# Patient Record
Sex: Male | Born: 1960
Health system: Southern US, Community
[De-identification: ages and names within clinical notes are randomized; demographics above are authoritative.]

## PROBLEM LIST (undated history)

## (undated) DIAGNOSIS — I10 Essential (primary) hypertension: Secondary | ICD-10-CM

## (undated) DIAGNOSIS — I2510 Atherosclerotic heart disease of native coronary artery without angina pectoris: Secondary | ICD-10-CM

## (undated) DIAGNOSIS — E119 Type 2 diabetes mellitus without complications: Secondary | ICD-10-CM

---

## 2006-08-02 ENCOUNTER — Emergency Department (HOSPITAL_COMMUNITY): Admission: EM | Admit: 2006-08-02 | Discharge: 2006-08-02 | Payer: Self-pay | Admitting: Emergency Medicine

## 2006-09-11 ENCOUNTER — Emergency Department (HOSPITAL_COMMUNITY): Admission: EM | Admit: 2006-09-11 | Discharge: 2006-09-11 | Payer: Self-pay | Admitting: Emergency Medicine

## 2006-09-20 ENCOUNTER — Ambulatory Visit: Payer: Self-pay | Admitting: Family Medicine

## 2006-09-23 ENCOUNTER — Ambulatory Visit: Payer: Self-pay | Admitting: *Deleted

## 2006-10-22 ENCOUNTER — Ambulatory Visit: Payer: Self-pay | Admitting: Family Medicine

## 2006-11-20 ENCOUNTER — Ambulatory Visit: Payer: Self-pay | Admitting: Family Medicine

## 2007-03-11 DIAGNOSIS — K219 Gastro-esophageal reflux disease without esophagitis: Secondary | ICD-10-CM | POA: Insufficient documentation

## 2007-03-11 DIAGNOSIS — I10 Essential (primary) hypertension: Secondary | ICD-10-CM

## 2007-03-11 DIAGNOSIS — J42 Unspecified chronic bronchitis: Secondary | ICD-10-CM

## 2007-03-11 DIAGNOSIS — E785 Hyperlipidemia, unspecified: Secondary | ICD-10-CM

## 2007-03-12 DIAGNOSIS — F17200 Nicotine dependence, unspecified, uncomplicated: Secondary | ICD-10-CM | POA: Insufficient documentation

## 2007-03-12 DIAGNOSIS — I2510 Atherosclerotic heart disease of native coronary artery without angina pectoris: Secondary | ICD-10-CM | POA: Insufficient documentation

## 2007-03-12 DIAGNOSIS — I219 Acute myocardial infarction, unspecified: Secondary | ICD-10-CM | POA: Insufficient documentation

## 2007-03-13 ENCOUNTER — Emergency Department (HOSPITAL_COMMUNITY): Admission: EM | Admit: 2007-03-13 | Discharge: 2007-03-13 | Payer: Self-pay | Admitting: Emergency Medicine

## 2007-03-24 ENCOUNTER — Ambulatory Visit: Payer: Self-pay | Admitting: Family Medicine

## 2007-03-24 LAB — CONVERTED CEMR LAB
Cholesterol: 115 mg/dL (ref 0–200)
HDL: 24 mg/dL — ABNORMAL LOW (ref >39)
Total CHOL/HDL Ratio: 4.8
Triglycerides: 110 mg/dL (ref <150)
VLDL: 22 mg/dL (ref 0–40)

## 2007-04-13 ENCOUNTER — Inpatient Hospital Stay (HOSPITAL_COMMUNITY): Admission: EM | Admit: 2007-04-13 | Discharge: 2007-04-15 | Payer: Self-pay | Admitting: Emergency Medicine

## 2007-04-14 ENCOUNTER — Encounter (INDEPENDENT_AMBULATORY_CARE_PROVIDER_SITE_OTHER): Payer: Self-pay | Admitting: Internal Medicine

## 2007-04-21 ENCOUNTER — Ambulatory Visit: Payer: Self-pay | Admitting: Family Medicine

## 2007-05-12 ENCOUNTER — Ambulatory Visit: Payer: Self-pay | Admitting: Family Medicine

## 2007-05-12 LAB — CONVERTED CEMR LAB
HDL: 27 mg/dL — ABNORMAL LOW (ref >39)
Triglycerides: 152 mg/dL — ABNORMAL HIGH (ref <150)

## 2007-08-12 ENCOUNTER — Ambulatory Visit (HOSPITAL_COMMUNITY): Admission: RE | Admit: 2007-08-12 | Discharge: 2007-08-12 | Payer: Self-pay | Admitting: Family Medicine

## 2007-08-12 ENCOUNTER — Ambulatory Visit: Payer: Self-pay | Admitting: Family Medicine

## 2007-09-02 ENCOUNTER — Ambulatory Visit (HOSPITAL_COMMUNITY): Admission: RE | Admit: 2007-09-02 | Discharge: 2007-09-02 | Payer: Self-pay | Admitting: Family Medicine

## 2007-10-23 ENCOUNTER — Emergency Department (HOSPITAL_COMMUNITY): Admission: EM | Admit: 2007-10-23 | Discharge: 2007-10-23 | Payer: Self-pay | Admitting: Emergency Medicine

## 2007-11-28 ENCOUNTER — Ambulatory Visit: Payer: Self-pay | Admitting: Internal Medicine

## 2008-01-29 ENCOUNTER — Ambulatory Visit: Payer: Self-pay | Admitting: Family Medicine

## 2008-02-11 ENCOUNTER — Ambulatory Visit: Payer: Self-pay | Admitting: Family Medicine

## 2008-02-11 LAB — CONVERTED CEMR LAB
Calcium: 8.8 mg/dL (ref 8.4–10.5)
Cholesterol: 131 mg/dL (ref 0–200)
LDL Cholesterol: 59 mg/dL (ref 0–99)
Total Bilirubin: 0.5 mg/dL (ref 0.3–1.2)
Total Protein: 7 g/dL (ref 6.0–8.3)

## 2008-07-25 ENCOUNTER — Emergency Department (HOSPITAL_COMMUNITY): Admission: EM | Admit: 2008-07-25 | Discharge: 2008-07-25 | Payer: Self-pay | Admitting: Emergency Medicine

## 2008-07-30 ENCOUNTER — Ambulatory Visit: Payer: Self-pay | Admitting: Family Medicine

## 2010-08-06 ENCOUNTER — Encounter: Payer: Self-pay | Admitting: Interventional Cardiology

## 2010-10-30 LAB — POCT I-STAT, CHEM 8
BUN: 11 mg/dL (ref 6–23)
Calcium, Ion: 1.03 mmol/L — ABNORMAL LOW (ref 1.12–1.32)
Glucose, Bld: 108 mg/dL — ABNORMAL HIGH (ref 70–99)
HCT: 43 % (ref 39.0–52.0)
Sodium: 136 mEq/L (ref 135–145)
TCO2: 26 mmol/L (ref 0–100)

## 2010-10-30 LAB — POCT CARDIAC MARKERS
CKMB, poc: 1.2 ng/mL (ref 1.0–8.0)
Myoglobin, poc: 116 ng/mL (ref 12–200)

## 2010-11-28 NOTE — H&P (Signed)
NAME:  Nathan Jenkins, [place] NO.:  [id number]   MEDICAL RECORD NO.:  [id number]          PATIENT TYPE:  EMS   LOCATION:  ED                           FACILITY:  [facility]   PHYSICIAN:  Herbie Saxon, MDDATE OF BIRTH:  09-30-1960   DATE OF ADMISSION:  04/13/2007  DATE OF DISCHARGE:                              HISTORY & PHYSICAL   PRIMARY CARE PHYSICIAN:  Maurice March, M.D. at [facility],  unassigned.   PRESENTING COMPLAINT:  Chest pain of one day's duration.   HISTORY OF PRESENTING COMPLAINT:  This is a 50 year old African American  male who woke up this morning at 8 a.m. and noticed substernal chest  pain, 8/10, burning in quality and radiating to the left arm, associated  with mild shortness of breath, no diaphoresis, no syncopal episode, no  loss of consciousness.  He denies any dizziness or lightheadedness.  He  also gives a history of associated upper abdominal pain and also says  the chest pain sometimes relieved when he burps.  He also noted that the  chest pain was slightly reduced by taking antacid.  He gives a positive  history of lifting heavy material yesterday because he went to help a  family member move and he also gives a history of having diarrhea for  about a week after he was started on gout medication.   PAST MEDICAL HISTORY:  1. Hypertension, 30 years' duration.  2. Myocardial infarction in 2005.  3. Mitral valve prolapse.  4. Gout.  5. Endocarditis.   SURGERY:  Angioplasty, 2005.   FAMILY HISTORY:  Father died of an MI.  Mother had heart disease.  All  his siblings have hypertension.   SOCIAL HISTORY:  He has been smoking one pack per day for greater than  30 years.  He drinks 4-6 bottles of beer daily for more than 30 years.  He is trying to slow down.  No history of drugs.  He said he moved from  [place] in January 2008.  He does not have any established  cardiologist in [place].   No known drug allergies.   MEDICATIONS:  1. Aspirin 325 mg daily.  2. Benazepril 40 mg q.h.s.  3. Diltiazem 118 mg daily.  4. HCTZ 12.5 mg daily.  5. Nitroglycerin 0.4 mg sublingual p.r.n.  6. Plavix 75 mg daily.  7. Protonix 40 mg daily.  8. Toprol XL 200 mg daily.  9. Allopurinol.  10.Colchicine.   REVIEW OF SYSTEMS:  MUSCULOSKELETAL:  There is no joint swelling.  No  skin rash.  Mood is stable.  Nine other systems reviewed, pertinent  positives as above.   PHYSICAL EXAMINATION:  GENERAL:  This is a middle-aged not in acute  respiratory distress.  VITAL SIGNS:  Temperature is 98, pulse is 78, respiratory rate is 17,  blood pressure 101/74.  HEENT:  Pupils equal and reactive to light and accommodation.  NECK:  Supple.  CHEST:  Clear.  HEART:  Sounds 1 and 2 regular.  ABDOMEN:  Soft, nontender.  No organomegaly.  Bowel sounds are  normoactive.  NEUROLOGIC:  He is  alert, oriented x3.  EXTREMITIES:  Peripheral pulses present.  No pedal edema.   LABORATORY:  Show WBC 7, hematocrit 38, platelet count is 171.  Glucose  is 102, sodium 138, potassium 3.1, chloride 103, bicarbonate 24, BUN 26,  creatinine 1.6.  CK-MB 2.2, troponin less than 0.05.  EKG shows a normal  sinus rhythm at 68 per minute.   ASSESSMENT:  1. Chest pain rule out myocardial infarction likely secondary to      musculoskeletal versus gastroesophageal reflux disease.  2. History of coronary artery disease, status post percutaneous      transluminal coronary angioplasty.  3. Hypokalemia.  4. Gastroenteritis.  5. Acute renal insufficiency.  6. History of gout.  7. Endocarditis.  8. Mitral valve prolapse.   PLAN:  1. The patient is being admitted to a telemetry bed.  2. We will check his serial cardiac enzymes and EKGs q.8 h. x3.  3. We will replete his potassium 40 mEq p.o.  4. Start IV fluids 1/2 normal saline at 60 cc/hr.  5. We will check his 2D echocardiogram.  6. If cardiac enzymes turn positive consider cardiology input for       possible stress testing a.m.  7. NPO after midnight.  8. We will start him on Protonix 40 mg IV daily.  9. Lovenox 40 mg subcutaneous daily.  10.Morphine 2 mg IV q.6 h. p.r.n.  11.Oxygen 2 liters nasal cannula p.r.n.  12.Aspirin 325 mg daily.  13.Continue with his home medications  14.Hold off with his diuretic agents for now.  15.Hold off the allopurinol and colchicine.  16.He is a full code.      Herbie Saxon, MD  Electronically Signed     MIO/MEDQ  D:  04/13/2007  T:  04/13/2007  Job:  [id number]

## 2010-11-28 NOTE — Discharge Summary (Signed)
Nathan Jenkins, [place] NO.:  [id number]   MEDICAL RECORD NO.:  [id number]          PATIENT TYPE:  INP   LOCATION:  1417                         FACILITY:  [facility]   PHYSICIAN:  Marcellus Scott, MD     DATE OF BIRTH:  05-04-1961   DATE OF ADMISSION:  04/13/2007  DATE OF DISCHARGE:  04/15/2007                               DISCHARGE SUMMARY   PRIMARY MEDICAL DOCTOR:  Dr. Dow Adolph, of [facility].   DISCHARGE DIAGNOSES:  1. Chest pain likely musculoskeletal - resolved.  2. Coronary artery disease, stable.  3. Hypertension.  4. Acute renal failure.  5. Tobacco abuse.  6. Alcohol dependence.  7. Mild rhabdomyolysis.  8. Hyperlipidemia.  9. Mild diastolic dysfunction.  10.Diarrhea- resolved   DISCHARGE MEDICATIONS:  1. Enteric coated aspirin 325 mg p.o. daily.  2. Benazepril 40 mg p.o. twice daily.  3. Cardizem 360 mg p.o. daily.  4. Hydrochlorothiazide 12.5 mg p.o. daily.  5. Nitroglycerin patch, one patch daily.  6. Plavix 75 mg p.o. daily.  7. Protonix 40 mg p.o. daily.  8. Colchicine 0.6 mg p.o. twice daily.  9. Metoprolol 100mg  p.o twicw daily.  10.Crestor 5 mg p.o daily.  11.Atrovent inhaler 2 puffs t.i.d p.r.n.  12.Thiamine 100mg  p.o. daily.  13.Folate 1mg  p.o. daily.  14.Multivitamins, one p.o. daily.  15.Ativan 1 mg p.o. b.i.d. for 1 day then 1 mg p.o. daily for a day      and then discontinue.   PROCEDURES:  1. Echocardiogram:   SUMMARY:  His overall left ventricular systolic function was normal.  Left ventricular ejection fraction was estimated ranging between 60 and  65%.  No left ventricular regional wall motion abnormality.  Left  ventricular wall thickness was at the upper limits of normal.  Features  were consistent with mild diastolic dysfunction.   1. Renal ultrasound on the 29th of September is normal, no abnormal      findings.   1. Chest x-ray:  Impression is minimal subsegmental atelectasis left      lung base.   PERTINENT LAB RESULTS:  Basic metabolic panel today with BUN of 15,  creatinine of 1.08, which on admission the creatinine was 1.68.  CBCs  with hemoglobin 12.7, hematocrit 36.93.  T3 and T4 normal.  TSH of  0.463.  Lipid panel with cholesterol 93, triglycerides 245, HDL 17, LDL  27, VLDL 49.  Cardiac panel was negative for features of acute MI but CK  was mildly elevated initially at 395 but decreasing gradually with  latest of 288.   CONSULTATIONS:  None.   HOSPITAL COURSE AND PATIENT DISPOSITION:  For details of initial  admission, please refer to the History and Physical Note.  In summary,  Nathan Jenkins is a 50 year old African-American male patient with history  of hypertension, prior MI, mitral valve prolapse who presented with  history of substernal 8/10 chest pain with associated mild dyspnea on  day of admission.  He was admitted as chest pain, rule out myocardial  infarction.  He was admitted to telemetry bed. His cardiac enzymes were  cycled and were not suggestive  of an acute MI.  His EKG was not  revealing for any acute changes.  He was placed on aspirin.  The patient  indicated that prior to admission patient helped his brother move, in  [place], where he lifted some heavy things, which could have contributed  to his musculoskeletal type of pain.  Patient gave history of  significant alcohol abuse and tobacco abuse and was placed on alcohol  withdrawal protocol.  His creatinine was mildly elevated, following  which he was placed on IV fluids and his ARBsand diuretics were held.  The etiology of his renal insufficiency may be secondary to diarrhea and  NSAID use. His creatinine has normalized.  The ARBs are being restarted.  In any case, patient at this time is stable with no diarrhea or chest  pain and no other complaints and stable to be discharged.  He is to  follow up with his primary medical doctor with CK and basic metabolic  panel within a week's time.  Patient has  also been instructed to seek  immediate medical attention if there is a any further deterioration of  condition.  He has been counseled against tobacco and substance abuse.      Marcellus Scott, MD  Electronically Signed     AH/MEDQ  D:  04/15/2007  T:  04/15/2007  Job:  [id number]   cc:   Maurice March, M.D.  Fax: [phone]

## 2011-04-26 LAB — POCT CARDIAC MARKERS
Myoglobin, poc: 154
Operator id: 4074
Troponin i, poc: <0.05
Troponin i, poc: <0.05

## 2011-04-26 LAB — CBC
HCT: 34.8 — ABNORMAL LOW
Hemoglobin: 12 — ABNORMAL LOW
MCHC: 34.6
MCV: 83.7
MCV: 84
Platelets: 155
RDW: 13
RDW: 13.3
WBC: 5.8

## 2011-04-26 LAB — BASIC METABOLIC PANEL
BUN: 15
BUN: 26 — ABNORMAL HIGH
CO2: 23
CO2: 24
Calcium: 8.8
Chloride: 106
Creatinine, Ser: 1.08
Creatinine, Ser: 1.68 — ABNORMAL HIGH
GFR calc non Af Amer: >60
Glucose, Bld: 102 — ABNORMAL HIGH
Glucose, Bld: 110 — ABNORMAL HIGH
Potassium: 3.1 — ABNORMAL LOW
Potassium: 3.8
Sodium: 136

## 2011-04-26 LAB — OVA AND PARASITE EXAMINATION: Ova and parasites: NONE SEEN

## 2011-04-26 LAB — T4, FREE: Free T4: 1.19

## 2011-04-26 LAB — URINALYSIS, ROUTINE W REFLEX MICROSCOPIC
Ketones, ur: NEGATIVE
Nitrite: NEGATIVE
Protein, ur: NEGATIVE
Urobilinogen, UA: 0.2

## 2011-04-26 LAB — CK TOTAL AND CKMB (NOT AT ARMC)
CK, MB: 4
Relative Index: 1
Total CK: 395 — ABNORMAL HIGH

## 2011-04-26 LAB — CARDIAC PANEL(CRET KIN+CKTOT+MB+TROPI)
CK, MB: 2.7
Total CK: 288 — ABNORMAL HIGH

## 2011-04-26 LAB — FECAL LACTOFERRIN, QUANT

## 2011-04-26 LAB — STOOL CULTURE

## 2011-04-26 LAB — PROTIME-INR: Prothrombin Time: 13.6

## 2011-04-26 LAB — LIPID PANEL
Cholesterol: 93
HDL: 17 — ABNORMAL LOW

## 2011-04-26 LAB — T3, FREE: T3, Free: 2.8 (ref 2.3–4.2)

## 2011-04-26 LAB — TSH: TSH: 0.336 — ABNORMAL LOW

## 2011-04-26 LAB — TROPONIN I: Troponin I: 0.04

## 2011-04-26 LAB — URINE MICROSCOPIC-ADD ON

## 2017-07-10 ENCOUNTER — Other Ambulatory Visit: Payer: Self-pay

## 2017-07-10 ENCOUNTER — Emergency Department (HOSPITAL_COMMUNITY)
Admission: EM | Admit: 2017-07-10 | Discharge: 2017-07-10 | Disposition: A | Discharge status: Home / Self Care | Payer: Medicare HMO | Attending: Emergency Medicine | Admitting: Emergency Medicine

## 2017-07-10 ENCOUNTER — Encounter (HOSPITAL_COMMUNITY): Payer: Self-pay | Admitting: Emergency Medicine

## 2017-07-10 DIAGNOSIS — F17200 Nicotine dependence, unspecified, uncomplicated: Secondary | ICD-10-CM | POA: Diagnosis not present

## 2017-07-10 DIAGNOSIS — E119 Type 2 diabetes mellitus without complications: Secondary | ICD-10-CM | POA: Insufficient documentation

## 2017-07-10 DIAGNOSIS — I2510 Atherosclerotic heart disease of native coronary artery without angina pectoris: Secondary | ICD-10-CM | POA: Diagnosis not present

## 2017-07-10 DIAGNOSIS — I10 Essential (primary) hypertension: Secondary | ICD-10-CM | POA: Insufficient documentation

## 2017-07-10 DIAGNOSIS — Z760 Encounter for issue of repeat prescription: Secondary | ICD-10-CM | POA: Insufficient documentation

## 2017-07-10 HISTORY — DX: Atherosclerotic heart disease of native coronary artery without angina pectoris: I25.10

## 2017-07-10 HISTORY — DX: Type 2 diabetes mellitus without complications: E11.9

## 2017-07-10 HISTORY — DX: Essential (primary) hypertension: I10

## 2017-07-10 MED ORDER — SITAGLIPTIN PHOSPHATE 100 MG PO TABS: 100.0000 mg | ORAL_TABLET | Freq: Every day | ORAL | 0 refills | Status: DC

## 2017-07-10 NOTE — ED Provider Notes (Signed)
[facility] Note   CSN: [id number] Arrival date & time: 07/10/17  [id number]     History   Chief Complaint Chief Complaint  Patient presents with  . Medication Refill    HPI Nathan Jenkins is a 56 y.o. male with a past medical history of hypertension, diabetes, who presents to ED for evaluation of refill of his Januvia.  States that he ran out earlier in the week.  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  He is established with a PCP in [place].  He reports compliance with his other home medications including hypertension medication.  States that his blood pressure usually runs high because he still smokes.  He denies any symptoms at this time.  HPI  Past Medical History:  Diagnosis Date  . Coronary artery disease   . Diabetes mellitus without complication (HCC)   . Hypertension     Patient Active Problem List   Diagnosis Date Noted  . TOBACCO USER 03/12/2007  . MYOCARDIAL INFARCTION, ACUTE 03/12/2007  . CORONARY ARTERY DISEASE 03/12/2007  . DYSLIPIDEMIA 03/11/2007  . HYPERTENSION 03/11/2007  . BRONCHITIS, CHRONIC 03/11/2007  . GERD 03/11/2007    History reviewed. No pertinent surgical history.     Home Medications    Prior to Admission medications   Medication Sig Start Date End Date Taking? Authorizing Provider  sitaGLIPtin (JANUVIA) 100 MG tablet Take 1 tablet (100 mg total) by mouth daily. 07/10/17   Dietrich PatesKhatri, Amiri Tritch, PA-C    Family History No family history on file.  Social History Social History   Tobacco Use  . Smoking status: Not on file  Substance Use Topics  . Alcohol use: Not on file  . Drug use: Not on file     Allergies   Patient has no known allergies.   Review of Systems Review of Systems  Constitutional: Negative for appetite change, chills and fever.  HENT: Negative for ear pain, rhinorrhea, sneezing and sore throat.   Respiratory: Negative for cough,  chest tightness, shortness of breath and wheezing.   Cardiovascular: Negative for chest pain and palpitations.  Gastrointestinal: Negative for abdominal pain, nausea and vomiting.  Musculoskeletal: Negative for myalgias.  Neurological: Negative for dizziness, weakness and light-headedness.     Physical Exam Updated Vital Signs BP (!) 156/112 (BP Location: Right Arm)   Pulse 77   Temp 98.8 F (37.1 C) (Oral)   Resp 18   SpO2 99%   Physical Exam  Constitutional: He appears well-developed and well-nourished. No distress.  HENT:  Head: Normocephalic and atraumatic.  Eyes: Conjunctivae and EOM are normal. No scleral icterus.  Neck: Normal range of motion.  Cardiovascular: Normal rate, regular rhythm and normal heart sounds.  Pulmonary/Chest: Effort normal and breath sounds normal. No respiratory distress.  Neurological: He is alert.  Skin: No rash noted. He is not diaphoretic.  Psychiatric: He has a normal mood and affect.  Nursing note and vitals reviewed.    ED Treatments / Results  Labs (all labs ordered are listed, but only abnormal results are displayed) Labs Reviewed - No data to display  EKG  EKG Interpretation None       Radiology No results found.  Procedures Procedures (including critical care time)  Medications Ordered in ED Medications - No data to display   Initial Impression / Assessment and Plan / ED Course  I have reviewed the triage vital signs and the nursing notes.  Pertinent labs &  imaging results that were available during my care of the patient were reviewed by me and considered in my medical decision making (see chart for details).     Patient presents to ED for evaluation of refill of his Januvia.  Has been on this medication for several months.  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  He is established with a PCP in [place].  Patient is hypertensive here in the ED however, he states that he is compliant with  his home blood pressure medications but states that "my blood pressure is always high because I still smoke."  He denies any symptoms at this time including chest pain, headache, shortness of breath or vision changes.  He does not feel like he needs to be admitted for his high blood pressure.  He is overall well-appearing.  Will give patient refill for Januvia and advised him to follow-up with his primary care provider for further evaluation.  Patient appears stable for discharge at this time.  Strict return precautions given.  Final Clinical Impressions(s) / ED Diagnoses   Final diagnoses:  Medication refill    ED Discharge Orders        Ordered    sitaGLIPtin (JANUVIA) 100 MG tablet  Daily     07/10/17 1041     Portions of this note were generated with Dragon dictation software. Dictation errors may occur despite best attempts at proofreading.    Dietrich PatesKhatri, Elliot Simoneaux, PA-C 07/10/17 1044    Lavera GuiseLiu, Dana Duo, MD 07/10/17 [phone]

## 2017-07-10 NOTE — ED Triage Notes (Signed)
Pt states he need a refill of Januvia because he moved and lost his medication.

## 2017-07-10 NOTE — Discharge Instructions (Signed)
Continue your home medications as previously prescribed. Follow-up with your primary care provider for further evaluation. Return to ED for chest pain, shortness of breath, increased vomiting with high blood sugars, injuries, lightheadedness or loss of consciousness.

## 2017-08-08 ENCOUNTER — Emergency Department (HOSPITAL_COMMUNITY)
Admission: EM | Admit: 2017-08-08 | Discharge: 2017-08-08 | Disposition: A | Discharge status: Home / Self Care | Payer: Medicare HMO | Attending: Emergency Medicine | Admitting: Emergency Medicine

## 2017-08-08 ENCOUNTER — Other Ambulatory Visit: Payer: Self-pay

## 2017-08-08 ENCOUNTER — Encounter (HOSPITAL_COMMUNITY): Payer: Self-pay | Admitting: *Deleted

## 2017-08-08 DIAGNOSIS — I119 Hypertensive heart disease without heart failure: Secondary | ICD-10-CM | POA: Diagnosis not present

## 2017-08-08 DIAGNOSIS — E119 Type 2 diabetes mellitus without complications: Secondary | ICD-10-CM | POA: Diagnosis not present

## 2017-08-08 DIAGNOSIS — Z760 Encounter for issue of repeat prescription: Secondary | ICD-10-CM | POA: Insufficient documentation

## 2017-08-08 DIAGNOSIS — I2510 Atherosclerotic heart disease of native coronary artery without angina pectoris: Secondary | ICD-10-CM | POA: Insufficient documentation

## 2017-08-08 DIAGNOSIS — F17210 Nicotine dependence, cigarettes, uncomplicated: Secondary | ICD-10-CM | POA: Insufficient documentation

## 2017-08-08 MED ORDER — SITAGLIPTIN PHOSPHATE 100 MG PO TABS: 100.0000 mg | ORAL_TABLET | Freq: Every day | ORAL | 0 refills | Status: DC

## 2017-08-08 NOTE — ED Triage Notes (Addendum)
Pt reports recently moving here and needs refill for his [place]. Has not missed a dosage, ran out this am. Denies any complaints.

## 2017-08-08 NOTE — ED Notes (Signed)
Patient here to have his Januvia refilled.

## 2017-08-08 NOTE — ED Provider Notes (Signed)
[facility]   CSN: [id number] Arrival date & time: 08/08/17  1109     History   Chief Complaint Chief Complaint  Patient presents with  . Medication Refill    HPI Nathan CuriaZannie Jenkins is a 57 y.o. male.  HPI   [name] is a 57 year old male with a history of CAD, hypertension, hyperlipidemia, type 2 diabetes mellitus who presents to the emergency department for medication refill.  Patient states he recently moved to [place].  Reports that he established primary care at [facility], although his first appointment is not until March.  States that he has run out of his Januvia medication and asking for refill today. Reports that he is compliant with his other home medications including his antihypertensives. Denies any symptoms at this time.   Past Medical History:  Diagnosis Date  . Coronary artery disease   . Diabetes mellitus without complication (HCC)   . Hypertension     Patient Active Problem List   Diagnosis Date Noted  . TOBACCO USER 03/12/2007  . MYOCARDIAL INFARCTION, ACUTE 03/12/2007  . CORONARY ARTERY DISEASE 03/12/2007  . DYSLIPIDEMIA 03/11/2007  . HYPERTENSION 03/11/2007  . BRONCHITIS, CHRONIC 03/11/2007  . GERD 03/11/2007    History reviewed. No pertinent surgical history.     Home Medications    Prior to Admission medications   Medication Sig Start Date End Date Taking? Authorizing Provider  sitaGLIPtin (JANUVIA) 100 MG tablet Take 1 tablet (100 mg total) by mouth daily. 07/10/17   Dietrich Jenkins, Hina, PA-C    Family History History reviewed. No pertinent family history.  Social History Social History   Tobacco Use  . Smoking status: Not on file  Substance Use Topics  . Alcohol use: Not on file  . Drug use: Not on file     Allergies   Patient has no known allergies.   Review of Systems Review of Systems  Eyes: Negative for visual disturbance.  Respiratory: Negative for  shortness of breath.   Cardiovascular: Negative for chest pain.  Gastrointestinal: Negative for abdominal pain, nausea and vomiting.  Neurological: Negative for headaches.     Physical Exam Updated Vital Signs BP (!) 148/98 (BP Location: Right Arm)   Pulse 69   Temp 98.5 F (36.9 C) (Oral)   Resp 16   Ht 5\' 4"  (1.626 m)   Wt 104.3 kg (230 lb)   SpO2 95%   BMI 39.48 kg/m   Physical Exam  Constitutional: He is oriented to person, place, and time. He appears well-developed and well-nourished. No distress.  HENT:  Head: Normocephalic and atraumatic.  Eyes: Right eye exhibits no discharge. Left eye exhibits no discharge.  Cardiovascular: Normal rate and regular rhythm.  Murmur (systolic grade 3/6 best heard in RUSB) heard. Pulmonary/Chest: Effort normal and breath sounds normal. No stridor. No respiratory distress. He has no wheezes. He has no rales.  Abdominal: Soft. Bowel sounds are normal. There is no tenderness. There is no guarding.  Neurological: He is alert and oriented to person, place, and time. Coordination normal.  Skin: Skin is warm and dry. He is not diaphoretic.  Psychiatric: He has a normal mood and affect. His behavior is normal.  Nursing note and vitals reviewed.    ED Treatments / Results  Labs (all labs ordered are listed, but only abnormal results are displayed) Labs Reviewed - No data to display  EKG  EKG Interpretation None       Radiology No  results found.  Procedures Procedures (including critical care time)  Medications Ordered in ED Medications - No data to display   Initial Impression / Assessment and Plan / ED Course  I have reviewed the triage vital signs and the nursing notes.  Pertinent labs & imaging results that were available during my care of the patient were reviewed by me and considered in my medical decision making (see chart for details).    Patient presents for medication refill.  [place]  of [place].  He has primary care appointment in March.  Will refill his Januvia medication.  His blood pressure was elevated in the ER today, counseled him to have this rechecked and to continue taking his antihypertensive medicine.  He denies symptoms of chest pain, shortness of breath, headache.  He has no complaints prior to discharge.  Final Clinical Impressions(s) / ED Diagnoses   Final diagnoses:  Medication refill    ED Discharge Orders        Ordered    sitaGLIPtin (JANUVIA) 100 MG tablet  Daily     08/08/17 1242       Kellie Shropshire, PA-C 08/08/17 1250    Charlynne Pander, MD 08/08/17 [phone]

## 2017-08-08 NOTE — Discharge Instructions (Addendum)
Your blood pressure was also elevated in the ER today, please have this rechecked.   Please go to your appointment in March with your primary doctor for further medication management.   Return to the Emergency Department if you have any new or worsening symptoms.

## 2017-08-27 ENCOUNTER — Emergency Department (HOSPITAL_COMMUNITY)
Admission: EM | Admit: 2017-08-27 | Discharge: 2017-08-27 | Disposition: A | Discharge status: Home / Self Care | Payer: Medicare HMO | Attending: Emergency Medicine | Admitting: Emergency Medicine

## 2017-08-27 ENCOUNTER — Encounter (HOSPITAL_COMMUNITY): Payer: Self-pay | Admitting: *Deleted

## 2017-08-27 ENCOUNTER — Other Ambulatory Visit: Payer: Self-pay

## 2017-08-27 ENCOUNTER — Emergency Department (HOSPITAL_COMMUNITY): Payer: Medicare HMO

## 2017-08-27 DIAGNOSIS — R0602 Shortness of breath: Secondary | ICD-10-CM | POA: Diagnosis not present

## 2017-08-27 DIAGNOSIS — Z79899 Other long term (current) drug therapy: Secondary | ICD-10-CM | POA: Insufficient documentation

## 2017-08-27 DIAGNOSIS — R079 Chest pain, unspecified: Secondary | ICD-10-CM | POA: Diagnosis not present

## 2017-08-27 DIAGNOSIS — I2510 Atherosclerotic heart disease of native coronary artery without angina pectoris: Secondary | ICD-10-CM | POA: Diagnosis not present

## 2017-08-27 DIAGNOSIS — I10 Essential (primary) hypertension: Secondary | ICD-10-CM | POA: Diagnosis not present

## 2017-08-27 DIAGNOSIS — E119 Type 2 diabetes mellitus without complications: Secondary | ICD-10-CM | POA: Diagnosis not present

## 2017-08-27 DIAGNOSIS — R072 Precordial pain: Secondary | ICD-10-CM | POA: Diagnosis not present

## 2017-08-27 LAB — CBC
HCT: 38.3 % — ABNORMAL LOW (ref 39.0–52.0)
HEMOGLOBIN: 13.1 g/dL (ref 13.0–17.0)
MCH: 29 pg (ref 26.0–34.0)
MCHC: 34.2 g/dL (ref 30.0–36.0)
MCV: 84.9 fL (ref 78.0–100.0)
Platelets: 169 10*3/uL (ref 150–400)
RBC: 4.51 MIL/uL (ref 4.22–5.81)
RDW: 12.5 % (ref 11.5–15.5)
WBC: 6.4 10*3/uL (ref 4.0–10.5)

## 2017-08-27 LAB — I-STAT TROPONIN, ED
TROPONIN I, POC: 0 ng/mL (ref 0.00–0.08)
Troponin i, poc: 0 ng/mL (ref 0.00–0.08)

## 2017-08-27 LAB — BASIC METABOLIC PANEL
ANION GAP: 12 (ref 5–15)
BUN: 9 mg/dL (ref 6–20)
CHLORIDE: 103 mmol/L (ref 101–111)
CO2: 26 mmol/L (ref 22–32)
Calcium: 8.9 mg/dL (ref 8.9–10.3)
Creatinine, Ser: 0.89 mg/dL (ref 0.61–1.24)
GFR calc non Af Amer: >60 mL/min (ref >60)
Glucose, Bld: 92 mg/dL (ref 65–99)
POTASSIUM: 3.4 mmol/L — AB (ref 3.5–5.1)
Sodium: 141 mmol/L (ref 135–145)

## 2017-08-27 MED ORDER — AMLODIPINE BESYLATE 10 MG PO TABS: 10.0000 mg | ORAL_TABLET | Freq: Every day | ORAL | 0 refills | Status: DC

## 2017-08-27 NOTE — ED Notes (Signed)
[id number]

## 2017-08-27 NOTE — Discharge Instructions (Signed)
Your workup has been reassuring in the ED today.  Have given you a refill of your blood pressure medication.  Make sure you follow-up with your cardiologist at your scheduled appointment and return to ED with any worsening symptoms.

## 2017-08-27 NOTE — ED Notes (Signed)
Patient states that he is here because he "ran out of medicine". He said that he takes amlodipine 10mg . Patient denies chest pain at this time. Stating "I'm over that"

## 2017-08-27 NOTE — ED Notes (Signed)
D/c reviewed with patient. No further questions at this

## 2017-08-27 NOTE — ED Triage Notes (Signed)
Pt reports central intermittent chest pain that started this morning with movement. Pt also reports being her for a refill of his amlodipine. Pt has one tablet left.

## 2017-08-27 NOTE — ED Provider Notes (Signed)
[facility] EMERGENCY DEPARTMENT Provider Note   CSN: [id number] Arrival date & time: 08/27/17  0846     History   Chief Complaint Chief Complaint  Patient presents with  . Chest Pain    HPI Nathan Jenkins is a 57 y.o. male.  HPI 57 year old African-American male past medical history significant for CAD, diabetes, hypertension presents to the emergency department today for initial evaluation of chest pain.  However at this time patient states that he just needs a refill of his blood pressure medication.  Patient states that approximately 5:00 this morning he had some substernal chest pain that did not radiate.  Patient states that the chest pain was tightness in his chest.  He reports some shortness of breath however this is baseline for patient.  Patient states that the chest pain lasted for approximately 30 minutes and self resolved.  Patient states that this is not what he felt like when he had stents placed before.  Pt states it feels like "muscular pain". Patient denies any associated nausea, vomiting, diaphoresis.  He did not taking for symptoms prior to arrival.  Patient reports having stent placed in 2005.  States that he recently had a stress test recently that was normal however unable to find this in the computer.  States this was performed in [place].  Patient states that his stent was placed in [place].  Patient states that he does have an follow-up appointment with cardiology at [facility] at the end of this month.  Patient denies any further chest pain at this time.  States that he is running low on his amlodipine and needs help getting this refilled until he can see the cardiology group within the month.  Patient denies any lower extremity edema or calf tenderness.  Denies any pleuritic chest pain.  He is a daily tobacco user.  Chest pain was nonexertional.  Pt denies any fever, chill, ha, vision changes, lightheadedness, dizziness,  congestion, neck pain,  cough, abd pain, n/v/d, urinary symptoms, change in bowel habits, melena, hematochezia, lower extremity paresthesias.  Past Medical History:  Diagnosis Date  . Coronary artery disease   . Diabetes mellitus without complication (HCC)   . Hypertension     Patient Active Problem List   Diagnosis Date Noted  . TOBACCO USER 03/12/2007  . MYOCARDIAL INFARCTION, ACUTE 03/12/2007  . CORONARY ARTERY DISEASE 03/12/2007  . DYSLIPIDEMIA 03/11/2007  . HYPERTENSION 03/11/2007  . BRONCHITIS, CHRONIC 03/11/2007  . GERD 03/11/2007    History reviewed. No pertinent surgical history.     Home Medications    Prior to Admission medications   Medication Sig Start Date End Date Taking? Authorizing Provider  sitaGLIPtin (JANUVIA) 100 MG tablet Take 1 tablet (100 mg total) by mouth daily. 08/08/17   Kellie Shropshire, PA-C    Family History No family history on file.  Social History Social History   Tobacco Use  . Smoking status: Not on file  Substance Use Topics  . Alcohol use: Not on file  . Drug use: Not on file     Allergies   Patient has no known allergies.   Review of Systems Review of Systems  Constitutional: Negative for chills, diaphoresis and fever.  HENT: Negative for congestion.   Eyes: Negative for visual disturbance.  Respiratory: Positive for shortness of breath (baseline). Negative for cough.   Cardiovascular: Positive for chest pain. Negative for palpitations and leg swelling.  Gastrointestinal: Negative for abdominal pain, diarrhea, nausea and  vomiting.  Genitourinary: Negative for dysuria, flank pain, frequency, hematuria and urgency.  Musculoskeletal: Negative for arthralgias and myalgias.  Skin: Negative for rash.  Neurological: Negative for dizziness, syncope, weakness, light-headedness, numbness and headaches.  Psychiatric/Behavioral: Negative for sleep disturbance. The patient is not nervous/anxious.      Physical Exam Updated  Vital Signs BP (!) 151/96   Pulse 72   Temp 98.2 F (36.8 C) (Oral)   Resp 13   Ht 5\' 4"  (1.626 m)   Wt 104.3 kg (230 lb)   SpO2 96%   BMI 39.48 kg/m   Physical Exam  Constitutional: He is oriented to person, place, and time. He appears well-developed and well-nourished.  Non-toxic appearance. No distress.  HENT:  Head: Normocephalic and atraumatic.  Nose: Nose normal.  Mouth/Throat: Oropharynx is clear and moist.  Eyes: Conjunctivae are normal. Pupils are equal, round, and reactive to light. Right eye exhibits no discharge. Left eye exhibits no discharge.  Neck: Normal range of motion. Neck supple. No JVD present. No tracheal deviation present.  Cardiovascular: Normal rate, regular rhythm, normal heart sounds and intact distal pulses. Exam reveals no gallop and no friction rub.  No murmur heard. Pulmonary/Chest: Effort normal. No stridor. No respiratory distress. He has decreased breath sounds. He has no wheezes. He has no rhonchi. He has no rales. He exhibits tenderness (anterior chest).  No hypoxia or tachypnea.  Abdominal: Soft. Bowel sounds are normal. He exhibits no distension. There is no tenderness. There is no rebound and no guarding.  Musculoskeletal: Normal range of motion. He exhibits no tenderness.       Right lower leg: Normal.       Left lower leg: Normal.  No lower extremity edema or calf tenderness.  Lymphadenopathy:    He has no cervical adenopathy.  Neurological: He is alert and oriented to person, place, and time.  Skin: Skin is warm and dry. Capillary refill takes less than 2 seconds. No rash noted. He is not diaphoretic.  Psychiatric: His behavior is normal. Judgment and thought content normal.  Nursing note and vitals reviewed.    ED Treatments / Results  Labs (all labs ordered are listed, but only abnormal results are displayed) Labs Reviewed  BASIC METABOLIC PANEL - Abnormal; Notable for the following components:      Result Value   Potassium 3.4  (*)    All other components within normal limits  CBC - Abnormal; Notable for the following components:   HCT 38.3 (*)    All other components within normal limits  I-STAT TROPONIN, ED  I-STAT TROPONIN, ED    EKG  EKG Interpretation  Date/Time:  Tuesday August 27 2017 09:05:08 EST Ventricular Rate:  77 PR Interval:  164 QRS Duration: 90 QT Interval:  382 QTC Calculation: 432 R Axis:   -12 Text Interpretation:  Normal sinus rhythm Nonspecific T wave abnormality Confirmed by Gwyneth SproutPlunkett, Whitney ([id number]) on 08/27/2017 2:27:05 PM       Radiology Dg Chest 2 View  Result Date: 08/27/2017 CLINICAL DATA:  Chest pain, shortness of breath. EXAM: CHEST  2 VIEW COMPARISON:  Radiographs of July 25, 2008. FINDINGS: The heart size and mediastinal contours are within normal limits. Both lungs are clear. No pneumothorax or pleural effusion is noted. Atherosclerosis of thoracic aorta is noted. The visualized skeletal structures are unremarkable. IMPRESSION: No active cardiopulmonary disease.  Aortic atherosclerosis. Electronically Signed   By: Lupita RaiderJames  Green Jr, M.D.   On: 08/27/2017 09:54    Procedures Procedures (  including critical care time)  Medications Ordered in ED Medications - No data to display   Initial Impression / Assessment and Plan / ED Course  I have reviewed the triage vital signs and the nursing notes.  Pertinent labs & imaging results that were available during my care of the patient were reviewed by me and considered in my medical decision making (see chart for details).     Pt presents to the Ed today with complaints of cp. Patient is to be discharged with recommendation to follow up with PCP in regards to today's hospital visit.  Patient has cardiology follow-up in 2 weeks.  Normal stress test several months ago per patient that was normal.  Chest pain is not likely of cardiac or pulmonary etiology d/t presentation, VSS, no tracheal deviation, no JVD or new murmur, RRR,  breath sounds equal bilaterally, EKG without any change from prior tracing and shows no signs of ischemia, negative delta troponin, and negative CXR.  Clinical presentation does not seem consistent with dissection, PE.  Discussed with patient that we cannot guarantee that this is not cardiac in nature however seems less likely with workup today.  Have discussed with patient that we could possibly admit patient for cardiac workup however patien patient would like to avoid at this time. Which I am in agreeance. Discussed with patient that if he is experiencing further episodes of worsening episodes to return to the ED.  Pt has been advised to return to the ED is CP becomes exertional, associated with diaphoresis or nausea, radiates to left jaw/arm, worsens or becomes concerning in any way. Pt appears reliable for follow up and is agreeable to discharge.   Case has been discussed with and seen by Dr. Anitra Lauth who agrees with the above plan to discharge.     Final Clinical Impressions(s) / ED Diagnoses   Final diagnoses:  Nonspecific chest pain    ED Discharge Orders        Ordered    amLODipine (NORVASC) 10 MG tablet  Daily     08/27/17 1343       Wallace Keller 08/27/17 1448    Gwyneth Sprout, MD 08/27/17 1629

## 2017-09-07 ENCOUNTER — Other Ambulatory Visit: Payer: Self-pay

## 2017-09-07 ENCOUNTER — Encounter (HOSPITAL_COMMUNITY): Payer: Self-pay | Admitting: Emergency Medicine

## 2017-09-07 ENCOUNTER — Emergency Department (HOSPITAL_COMMUNITY)
Admission: EM | Admit: 2017-09-07 | Discharge: 2017-09-07 | Disposition: A | Discharge status: Home / Self Care | Payer: Medicare HMO | Attending: Emergency Medicine | Admitting: Emergency Medicine

## 2017-09-07 DIAGNOSIS — F17200 Nicotine dependence, unspecified, uncomplicated: Secondary | ICD-10-CM | POA: Insufficient documentation

## 2017-09-07 DIAGNOSIS — I10 Essential (primary) hypertension: Secondary | ICD-10-CM | POA: Diagnosis not present

## 2017-09-07 DIAGNOSIS — I252 Old myocardial infarction: Secondary | ICD-10-CM | POA: Insufficient documentation

## 2017-09-07 DIAGNOSIS — I2510 Atherosclerotic heart disease of native coronary artery without angina pectoris: Secondary | ICD-10-CM | POA: Diagnosis not present

## 2017-09-07 DIAGNOSIS — Z79899 Other long term (current) drug therapy: Secondary | ICD-10-CM | POA: Insufficient documentation

## 2017-09-07 DIAGNOSIS — Z760 Encounter for issue of repeat prescription: Secondary | ICD-10-CM | POA: Diagnosis not present

## 2017-09-07 MED ORDER — SITAGLIPTIN PHOSPHATE 100 MG PO TABS: 100.0000 mg | ORAL_TABLET | Freq: Every day | ORAL | 0 refills | Status: DC

## 2017-09-07 NOTE — ED Triage Notes (Signed)
Pt denies any health  Needs other than med refill.

## 2017-09-07 NOTE — ED Notes (Signed)
Declined W/C at D/C and was escorted to lobby by RN. 

## 2017-09-07 NOTE — ED Triage Notes (Signed)
Patient presents needing refill for his [place].  Denies any symptoms or needs for assessment.

## 2017-09-07 NOTE — Discharge Instructions (Addendum)
You were given a medication refill for your Januvia.  Please take this as directed. You were noted to have high blood pressure today during your visit in the emergency department. You will need to follow up with your primary healthcare provider to have your blood pressure rechecked as you may need to be started on medication for this if it remains elevated. If you experience any chest pain, shortness of breath, headaches, vision changes, numbness, weakness, lightheadedness, changes in mental status, or decrease in urination you should return to the emergency department immediately.

## 2017-09-07 NOTE — ED Provider Notes (Signed)
[facility] EMERGENCY DEPARTMENT Provider Note   CSN: [id number] Arrival date & time: 09/07/17  1140     History   Chief Complaint Chief Complaint  Patient presents with  . Medication Refill    HPI Nathan Jenkins. is a 57 y.o. male.  HPI   Pt is a 57 y/o male who presents to the ED requesting a medication refill for his [place].  States he recently just moved here from [place] and he does not have a primary care appointment until 10/05/17.  States that he does not know what blood pressure medication he takes but he has a history of blood pressure.  He denies any symptoms or complaints today.   Past Medical History:  Diagnosis Date  . Coronary artery disease   . Diabetes mellitus without complication (HCC)   . Hypertension     Patient Active Problem List   Diagnosis Date Noted  . TOBACCO USER 03/12/2007  . MYOCARDIAL INFARCTION, ACUTE 03/12/2007  . CORONARY ARTERY DISEASE 03/12/2007  . DYSLIPIDEMIA 03/11/2007  . HYPERTENSION 03/11/2007  . BRONCHITIS, CHRONIC 03/11/2007  . GERD 03/11/2007    History reviewed. No pertinent surgical history.     Home Medications    Prior to Admission medications   Medication Sig Start Date End Date Taking? Authorizing Provider  amLODipine (NORVASC) 10 MG tablet Take 1 tablet (10 mg total) by mouth daily. 08/27/17   Rise Mu, PA-C  carvedilol (COREG) 25 MG tablet Take 25 mg by mouth 2 (two) times daily with a meal.    [provider]  metFORMIN (GLUCOPHAGE) 1000 MG tablet Take 1,000 mg by mouth 2 (two) times daily with a meal.    [provider]  omeprazole (PRILOSEC) 20 MG capsule Take 20 mg by mouth daily.    [provider]  sitaGLIPtin (JANUVIA) 100 MG tablet Take 1 tablet (100 mg total) by mouth daily. 09/07/17   Cully Luckow S, PA-C    Family History History reviewed. No pertinent family history.  Social History Social History   Tobacco Use  . Smoking  status: Current Every Day Smoker    Packs/day: 0.33  . Smokeless tobacco: Never Used  Substance Use Topics  . Alcohol use: Yes    Frequency: Never    Comment: socially  . Drug use: No     Allergies   Patient has no known allergies.   Review of Systems Review of Systems  Constitutional: Negative for fever.  Eyes: Negative for visual disturbance.  Respiratory: Negative for shortness of breath.   Cardiovascular: Negative for chest pain and leg swelling.  Gastrointestinal: Negative for abdominal pain.  Genitourinary: Negative for decreased urine volume.  Musculoskeletal: Negative for back pain.  Neurological: Negative for dizziness, weakness, light-headedness, numbness and headaches.     Physical Exam Updated Vital Signs BP (!) 142/95 (BP Location: Right Arm)   Pulse 79   Temp 98.5 F (36.9 C) (Oral)   Resp 18   SpO2 99%   Physical Exam  Constitutional: He is oriented to person, place, and time. He appears well-developed and well-nourished. No distress.  Eyes: Conjunctivae are normal.  Cardiovascular: Normal rate and regular rhythm.  Pulmonary/Chest: Effort normal and breath sounds normal.  Neurological: He is alert and oriented to person, place, and time. No cranial nerve deficit.  Skin: Skin is warm and dry. Capillary refill takes less than 2 seconds.  Psychiatric: He has a normal mood and affect.  Nursing note and vitals reviewed.  ED Treatments / Results  Labs (all labs ordered are listed, but only abnormal results are displayed) Labs Reviewed - No data to display  EKG  EKG Interpretation None       Radiology No results found.  Procedures Procedures (including critical care time)  Medications Ordered in ED Medications - No data to display   Initial Impression / Assessment and Plan / ED Course  I have reviewed the triage vital signs and the nursing notes.  Pertinent labs & imaging results that were available during my care of the patient were  reviewed by me and considered in my medical decision making (see chart for details).     Final Clinical Impressions(s) / ED Diagnoses   Final diagnoses:  Medication refill  Hypertension, unspecified type   57 year old male with a history of diabetes, high blood pressure, CAD who presents the ED today requesting a medication refill of his Januvia.  Patient states he recently here from [place] and has a primary care appointment on 10/05/17.  Vital signs stable and no complaints today.  Also noted to have high blood pressure today in the ED, which seems consistent with his prior presentations.  States he does not know what blood pressure medication he takes.  States he does not have headache, chest pain, shortness of breath, decreased urine output, or any other symptoms suggestive of endorgan damage. Discussed elevated blood pressure with the patient and the need for primary care follow up with potential need to initiate or change antihypertensive medications and or for further evaluation. Discussed return precaution signs/symptoms for hypertensive emergency as listed above with the patient. He confirmed understanding.     ED Discharge Orders        Ordered    sitaGLIPtin (JANUVIA) 100 MG tablet  Daily     09/07/17 [address]1536       Amanda Steuart S, PA-C 09/07/17 1550    Bethann BerkshireZammit, Joseph, MD 09/08/17 [phone]

## 2017-09-23 ENCOUNTER — Encounter (HOSPITAL_COMMUNITY): Payer: Self-pay | Admitting: Emergency Medicine

## 2017-09-23 ENCOUNTER — Other Ambulatory Visit: Payer: Self-pay

## 2017-09-23 ENCOUNTER — Ambulatory Visit (HOSPITAL_COMMUNITY)
Admission: EM | Admit: 2017-09-23 | Discharge: 2017-09-23 | Disposition: A | Discharge status: Home / Self Care | Payer: Medicare HMO | Attending: Family Medicine | Admitting: Family Medicine

## 2017-09-23 DIAGNOSIS — Z760 Encounter for issue of repeat prescription: Secondary | ICD-10-CM | POA: Diagnosis not present

## 2017-09-23 DIAGNOSIS — I10 Essential (primary) hypertension: Secondary | ICD-10-CM | POA: Diagnosis not present

## 2017-09-23 MED ORDER — FUROSEMIDE 20 MG PO TABS: 20.0000 mg | ORAL_TABLET | Freq: Every day | ORAL | 1 refills | Status: DC

## 2017-09-23 MED ORDER — LOSARTAN POTASSIUM 100 MG PO TABS: 100.0000 mg | ORAL_TABLET | Freq: Every day | ORAL | 1 refills | Status: DC

## 2017-09-23 NOTE — ED Triage Notes (Signed)
Pt here for med refill. No symptoms.

## 2017-09-23 NOTE — ED Provider Notes (Signed)
[facility]   [id number] 09/23/17 Arrival Time: 0955  ASSESSMENT & PLAN:  1. Encounter for medication refill   2. Essential hypertension     Meds ordered this encounter  Medications  . furosemide (LASIX) 20 MG tablet    Sig: Take 1 tablet (20 mg total) by mouth daily.    Dispense:  30 tablet    Refill:  1  . losartan (COZAAR) 100 MG tablet    Sig: Take 1 tablet (100 mg total) by mouth daily.    Dispense:  30 tablet    Refill:  1   Has f/u appointment with new PCP in two weeks. May f/u here if needed.  Reviewed expectations re: course of current medical issues. Questions answered. Outlined signs and symptoms indicating need for more acute intervention. Patient verbalized understanding. After Visit Summary given.   SUBJECTIVE: History from: patient. Nathan Francia GreavesDeberry Jr. is a 57 y.o. male who presents requesting medication refill. No current concerns. Has new PCP f/u soon. Taking medications as directed. Denies SOB/CP/LE edema.  Current medical problems include:  Past Medical History:  Diagnosis Date  . Coronary artery disease   . Diabetes mellitus without complication (HCC)   . Hypertension    Current Outpatient Medications (Endocrine & Metabolic):  .  metFORMIN (GLUCOPHAGE) 1000 MG tablet, Take 1,000 mg by mouth 2 (two) times daily with a meal. .  sitaGLIPtin (JANUVIA) 100 MG tablet, Take 1 tablet (100 mg total) by mouth daily.  Current Outpatient Medications (Cardiovascular):  .  amLODipine (NORVASC) 10 MG tablet, Take 1 tablet (10 mg total) by mouth daily. .  carvedilol (COREG) 25 MG tablet, Take 25 mg by mouth 2 (two) times daily with a meal.  Current Outpatient Medications (Other):  .  omeprazole (PRILOSEC) 20 MG capsule, Take 20 mg by mouth daily.  ROS: As per HPI.   OBJECTIVE:  Vitals:   09/23/17 1013  BP: (!) 164/115  Pulse: 93  Resp: 18  Temp: 98.2 F (36.8 C)  SpO2: 96%    General appearance: alert; no distress Neck: supple  Lungs:  clear to auscultation bilaterally Heart: regular rate and rhythm Abdomen: soft, non-tender Extremities: no edema; symmetrical with no gross deformities Skin: warm and dry Neurologic: normal gait Psychological: alert and cooperative; normal mood and affect  Labs: Results for orders placed or performed during the hospital encounter of 08/27/17  Basic metabolic panel  Result Value Ref Range   Sodium 141 135 - 145 mmol/L   Potassium 3.4 (L) 3.5 - 5.1 mmol/L   Chloride 103 101 - 111 mmol/L   CO2 26 22 - 32 mmol/L   Glucose, Bld 92 65 - 99 mg/dL   BUN 9 6 - 20 mg/dL   Creatinine, Ser 7.820.89 0.61 - 1.24 mg/dL   Calcium 8.9 8.9 - 95.610.3 mg/dL   GFR calc non Af Amer >60 >60 mL/min   GFR calc Af Amer >60 >60 mL/min   Anion gap 12 5 - 15  CBC  Result Value Ref Range   WBC 6.4 4.0 - 10.5 K/uL   RBC 4.51 4.22 - 5.81 MIL/uL   Hemoglobin 13.1 13.0 - 17.0 g/dL   HCT 21.338.3 (L) 08.639.0 - 57.852.0 %   MCV 84.9 78.0 - 100.0 fL   MCH 29.0 26.0 - 34.0 pg   MCHC 34.2 30.0 - 36.0 g/dL   RDW 46.912.5 62.911.5 - 52.815.5 %   Platelets 169 150 - 400 K/uL  I-stat troponin, ED  Result Value Ref Range  Troponin i, poc 0.00 0.00 - 0.08 ng/mL   Comment 3          I-stat troponin, ED  Result Value Ref Range   Troponin i, poc 0.00 0.00 - 0.08 ng/mL   Comment 3           No Known Allergies  Past Medical History:  Diagnosis Date  . Coronary artery disease   . Diabetes mellitus without complication (HCC)   . Hypertension    Social History   Socioeconomic History  . Marital status: Married    Spouse name: Not on file  . Number of children: Not on file  . Years of education: Not on file  . Highest education level: Not on file  Social Needs  . Financial resource strain: Not on file  . Food insecurity - worry: Not on file  . Food insecurity - inability: Not on file  . Transportation needs - medical: Not on file  . Transportation needs - non-medical: Not on file  Occupational History  . Not on file  Tobacco Use    . Smoking status: Current Every Day Smoker    Packs/day: 0.33  . Smokeless tobacco: Never Used  Substance and Sexual Activity  . Alcohol use: Yes    Frequency: Never    Comment: socially  . Drug use: No  . Sexual activity: Not on file  Other Topics Concern  . Not on file  Social History Narrative  . Not on file    No past surgical history on file.   Mardella Layman, MD 09/23/17 1038

## 2017-10-07 ENCOUNTER — Other Ambulatory Visit (INDEPENDENT_AMBULATORY_CARE_PROVIDER_SITE_OTHER): Payer: Medicare HMO

## 2017-10-07 ENCOUNTER — Encounter: Payer: Self-pay | Admitting: Nurse Practitioner

## 2017-10-07 ENCOUNTER — Ambulatory Visit (INDEPENDENT_AMBULATORY_CARE_PROVIDER_SITE_OTHER): Payer: Medicare HMO | Admitting: Nurse Practitioner

## 2017-10-07 ENCOUNTER — Ambulatory Visit (INDEPENDENT_AMBULATORY_CARE_PROVIDER_SITE_OTHER)
Admission: RE | Admit: 2017-10-07 | Discharge: 2017-10-07 | Disposition: A | Discharge status: Home / Self Care | Payer: Medicare HMO | Source: Ambulatory Visit | Attending: Nurse Practitioner | Admitting: Nurse Practitioner

## 2017-10-07 VITALS — BP 158/108 | HR 82 | Temp 99.1°F | Resp 16 | Ht 64.0 in | Wt 237.0 lb

## 2017-10-07 DIAGNOSIS — R221 Localized swelling, mass and lump, neck: Secondary | ICD-10-CM

## 2017-10-07 DIAGNOSIS — E118 Type 2 diabetes mellitus with unspecified complications: Secondary | ICD-10-CM

## 2017-10-07 DIAGNOSIS — K219 Gastro-esophageal reflux disease without esophagitis: Secondary | ICD-10-CM

## 2017-10-07 DIAGNOSIS — E785 Hyperlipidemia, unspecified: Secondary | ICD-10-CM

## 2017-10-07 DIAGNOSIS — I10 Essential (primary) hypertension: Secondary | ICD-10-CM

## 2017-10-07 LAB — COMPREHENSIVE METABOLIC PANEL
ALT: 14 U/L (ref 0–53)
AST: 12 U/L (ref 0–37)
Albumin: 3.8 g/dL (ref 3.5–5.2)
Alkaline Phosphatase: 62 U/L (ref 39–117)
BUN: 11 mg/dL (ref 6–23)
CHLORIDE: 103 meq/L (ref 96–112)
CO2: 33 meq/L — AB (ref 19–32)
Calcium: 8.9 mg/dL (ref 8.4–10.5)
Creatinine, Ser: 0.82 mg/dL (ref 0.40–1.50)
GFR: 123.79 mL/min (ref >60.00)
GLUCOSE: 102 mg/dL — AB (ref 70–99)
Potassium: 3.6 mEq/L (ref 3.5–5.1)
SODIUM: 144 meq/L (ref 135–145)
Total Bilirubin: 0.8 mg/dL (ref 0.2–1.2)
Total Protein: 7.5 g/dL (ref 6.0–8.3)

## 2017-10-07 LAB — LIPID PANEL
Cholesterol: 105 mg/dL (ref 0–200)
HDL: 30.4 mg/dL — ABNORMAL LOW (ref >39.00)
LDL CALC: 50 mg/dL (ref 0–99)
NONHDL: 74.38
Total CHOL/HDL Ratio: 3
Triglycerides: 124 mg/dL (ref 0.0–149.0)
VLDL: 24.8 mg/dL (ref 0.0–40.0)

## 2017-10-07 LAB — HEMOGLOBIN A1C: Hgb A1c MFr Bld: 5.9 % (ref 4.6–6.5)

## 2017-10-07 MED ORDER — SITAGLIPTIN PHOSPHATE 100 MG PO TABS: 100.0000 mg | ORAL_TABLET | Freq: Every day | ORAL | 1 refills | Status: DC

## 2017-10-07 MED ORDER — ATORVASTATIN CALCIUM 20 MG PO TABS: 10.0000 mg | ORAL_TABLET | Freq: Every day | ORAL | 1 refills | Status: DC

## 2017-10-07 MED ORDER — CLONIDINE HCL 0.1 MG PO TABS: 0.1000 mg | ORAL_TABLET | Freq: Two times a day (BID) | ORAL | 1 refills | Status: DC

## 2017-10-07 MED ORDER — METFORMIN HCL 1000 MG PO TABS: 1000.0000 mg | ORAL_TABLET | Freq: Two times a day (BID) | ORAL | 1 refills | Status: DC

## 2017-10-07 MED ORDER — FUROSEMIDE 20 MG PO TABS: 20.0000 mg | ORAL_TABLET | Freq: Every day | ORAL | 1 refills | Status: DC

## 2017-10-07 NOTE — Patient Instructions (Addendum)
Please head downstairs for lab work/chest x-ray.  I have placed an order for an ultrasound of your neck . Our office will call you to schedule this appointment. You should hear from our office in 7-10 days.  I have increased your atorvastatin dosage to 20 mg daily  I have added clonidine 0.1 mg twice daily for your blood pressure. Please continue your other blood pressure medications as you have been taking them. Please try to check your blood pressure once daily or at least a few times a week, at the same time each day, and keep a log. Please return in about 1 month so I can see how you are doing on the new blood pressure medication.  It was good to meet you. Welcome to [facility]!   Mediterranean Diet A Mediterranean diet refers to food and lifestyle choices that are based on the traditions of countries located on the [place]. This way of eating has been shown to help prevent certain conditions and improve outcomes for people who have chronic diseases, like kidney disease and heart disease. What are tips for following this plan? Lifestyle  Cook and eat meals together with your family, when possible.  Drink enough fluid to keep your urine clear or pale yellow.  Be physically active every day. This includes: ? Aerobic exercise like running or swimming. ? Leisure activities like gardening, walking, or housework.  Get 7-8 hours of sleep each night.  If recommended by your health care provider, drink red wine in moderation. This means 1 glass a day for nonpregnant women and 2 glasses a day for men. A glass of wine equals 5 oz (150 mL). Reading food labels  Check the serving size of packaged foods. For foods such as rice and pasta, the serving size refers to the amount of cooked product, not dry.  Check the total fat in packaged foods. Avoid foods that have saturated fat or trans fats.  Check the ingredients list for added sugars, such as corn syrup. Shopping  At the grocery  store, buy most of your food from the areas near the walls of the store. This includes: ? Fresh fruits and vegetables (produce). ? Grains, beans, nuts, and seeds. Some of these may be available in unpackaged forms or large amounts (in bulk). ? Fresh seafood. ? Poultry and eggs. ? Low-fat dairy products.  Buy whole ingredients instead of prepackaged foods.  Buy fresh fruits and vegetables in-season from local farmers markets.  Buy frozen fruits and vegetables in resealable bags.  If you do not have access to quality fresh seafood, buy precooked frozen shrimp or canned fish, such as tuna, salmon, or sardines.  Buy small amounts of raw or cooked vegetables, salads, or olives from the deli or salad bar at your store.  Stock your pantry so you always have certain foods on hand, such as olive oil, canned tuna, canned tomatoes, rice, pasta, and beans. Cooking  Cook foods with extra-virgin olive oil instead of using butter or other vegetable oils.  Have meat as a side dish, and have vegetables or grains as your main dish. This means having meat in small portions or adding small amounts of meat to foods like pasta or stew.  Use beans or vegetables instead of meat in common dishes like chili or lasagna.  Experiment with different cooking methods. Try roasting or broiling vegetables instead of steaming or sauteing them.  Add frozen vegetables to soups, stews, pasta, or rice.  Add nuts or seeds for  added healthy fat at each meal. You can add these to yogurt, salads, or vegetable dishes.  Marinate fish or vegetables using olive oil, lemon juice, garlic, and fresh herbs. Meal planning  Plan to eat 1 vegetarian meal one day each week. Try to work up to 2 vegetarian meals, if possible.  Eat seafood 2 or more times a week.  Have healthy snacks readily available, such as: ? Vegetable sticks with hummus. ? [place] yogurt. ? Fruit and nut trail mix.  Eat balanced meals throughout the week.  This includes: ? Fruit: 2-3 servings a day ? Vegetables: 4-5 servings a day ? Low-fat dairy: 2 servings a day ? Fish, poultry, or lean meat: 1 serving a day ? Beans and legumes: 2 or more servings a week ? Nuts and seeds: 1-2 servings a day ? Whole grains: 6-8 servings a day ? Extra-virgin olive oil: 3-4 servings a day  Limit red meat and sweets to only a few servings a month What are my food choices?  Mediterranean diet ? Recommended ? Grains: Whole-grain pasta. Brown rice. Bulgar wheat. Polenta. Couscous. Whole-wheat bread. Orpah Cobb. ? Vegetables: Artichokes. Beets. Broccoli. Cabbage. Carrots. Eggplant. Green beans. Chard. Kale. Spinach. Onions. Leeks. Peas. Squash. Tomatoes. Peppers. Radishes. ? Fruits: Apples. Apricots. Avocado. Berries. Bananas. Cherries. Dates. Figs. Grapes. Lemons. Melon. Oranges. Peaches. Plums. Pomegranate. ? Meats and other protein foods: Beans. Almonds. Sunflower seeds. Pine nuts. Peanuts. Cod. Salmon. Scallops. Shrimp. Tuna. Tilapia. Clams. Oysters. Eggs. ? Dairy: Low-fat milk. Cheese. Greek yogurt. ? Beverages: Water. Red wine. Herbal tea. ? Fats and oils: Extra virgin olive oil. Avocado oil. Grape seed oil. ? Sweets and desserts: [place] yogurt with honey. Baked apples. Poached pears. Trail mix. ? Seasoning and other foods: Basil. Cilantro. Coriander. Cumin. Mint. Parsley. Sage. Rosemary. Tarragon. Garlic. Oregano. Thyme. Pepper. Balsalmic vinegar. Tahini. Hummus. Tomato sauce. Olives. Mushrooms. ? Limit these ? Grains: Prepackaged pasta or rice dishes. Prepackaged cereal with added sugar. ? Vegetables: Deep fried potatoes (french fries). ? Fruits: Fruit canned in syrup. ? Meats and other protein foods: Beef. Pork. Lamb. Poultry with skin. Hot dogs. Tomasa Blase. ? Dairy: Ice cream. Sour cream. Whole milk. ? Beverages: Juice. Sugar-sweetened soft drinks. Beer. Liquor and spirits. ? Fats and oils: Butter. Canola oil. Vegetable oil. Beef fat (tallow).  Lard. ? Sweets and desserts: Cookies. Cakes. Pies. Candy. ? Seasoning and other foods: Mayonnaise. Premade sauces and marinades. ? The items listed may not be a complete list. Talk with your dietitian about what dietary choices are right for you. Summary  The Mediterranean diet includes both food and lifestyle choices.  Eat a variety of fresh fruits and vegetables, beans, nuts, seeds, and whole grains.  Limit the amount of red meat and sweets that you eat.  Talk with your health care provider about whether it is safe for you to drink red wine in moderation. This means 1 glass a day for nonpregnant women and 2 glasses a day for men. A glass of wine equals 5 oz (150 mL). This information is not intended to replace advice given to you by your health care provider. Make sure you discuss any questions you have with your health care provider. Document Released: 02/23/2016 Document Revised: 03/27/2016 Document Reviewed: 02/23/2016 Elsevier Interactive Patient Education  [employer].

## 2017-10-07 NOTE — Assessment & Plan Note (Signed)
He is fasting, will update labs today Increased atorvastatin dosage due to history of MI Discussed the role of healthy diet and exercise in the management of cholesterol-See AVS for additional information provided to patient - Lipid panel; Future - atorvastatin (LIPITOR) 20 MG tablet; Take 0.5 tablets (10 mg total) by mouth daily.  Dispense: 30 tablet; Refill: 1

## 2017-10-07 NOTE — Assessment & Plan Note (Signed)
Stable, continue prilosec F/U for new or worsening symptoms

## 2017-10-07 NOTE — Assessment & Plan Note (Signed)
Blood pressure is not controlled. We discussed the long term risks of uncontrolled HTN including MI, Stroke, kidney failure, the role of healthy diet and exercise in the management of hypertension, and the importance of close follow up to reach BP goal of <140/90-See AVS for additional information provided to patient Recommended adding additional medication to reach BP goal and he is agreeable- we will continue current medications and add clonidine 0.1mg  BID Encouraged to log BP at home RTC in 1 month for F/U - Comprehensive metabolic panel; Future - furosemide (LASIX) 20 MG tablet; Take 1 tablet (20 mg total) by mouth daily.  Dispense: 90 tablet; Refill: 1 - cloNIDine (CATAPRES) 0.1 MG tablet; Take 1 tablet (0.1 mg total) by mouth 2 (two) times daily.  Dispense: 60 tablet; Refill: 1

## 2017-10-07 NOTE — Assessment & Plan Note (Signed)
Continue current medications Will update labs today and recommend med adjustments if needed - Comprehensive metabolic panel; Future - Hemoglobin A1c; Future - metFORMIN (GLUCOPHAGE) 1000 MG tablet; Take 1 tablet (1,000 mg total) by mouth 2 (two) times daily with a meal.  Dispense: 90 tablet; Refill: 1 - sitaGLIPtin (JANUVIA) 100 MG tablet; Take 1 tablet (100 mg total) by mouth daily.  Dispense: 90 tablet; Refill: 1

## 2017-10-07 NOTE — Progress Notes (Signed)
Name: Nathan Jenkins.   MRN: [id number]    DOB: [birth year]   Date:10/07/2017       Progress Note  Subjective  Chief Complaint  Chief Complaint  Patient presents with  . Establish Care    dry skin, medication refills and looked over    HPI Nathan Jenkins is establishing care with me today as a new patient to our clinic. He is just moving back to [place] from [place]. We will discuss his chronic medical conditions and he would like to talk about a mass on his neck as well.  Hypertension -maintained on amlodipine 10, carvedilol  BID, losartan 100 mg, lasix 20  Reports daily medication compliance without adverse medication effects. Reports he does not check his blood pressure routinely because it is always high. He says "there is no way we will get my blood pressure normal, no one else has been able to" Denies headaches, vision changes, chest pain, edema. He does report mild sob which is chronic for him- he relates this to being a smoker and says today he is not ready to quit  BP Readings from Last 3 Encounters:  10/07/17 (!) 158/108  09/23/17 (!) 164/115  09/07/17 (!) 142/95   Diabetes- maintained on metformin 1000 BID, januvia 100  Reports daily medication compliance without adverse medication effects. Reports he checks blood sugar readings at home about every 3 days- typically less than 150. Denies tremor, diaphoresis, polyuria, polydipsia, polyphagia.  No results found for: HGBA1C  Cholesterol- maintained on atorvastatin , aspirin 325 daily Reports daily medication compliance without adverse medication effects including myalgias. Diet- does not watch his diet Exercise- does not exercise History of a heart attack in 2005  GERD- maintained on prilosec 20 daily Reports daily medication compliance without noted any adverse effects Does experience symptoms of heartburn if he misses a dose of prilosec  Mass on neck- This is a new problem He first noticed this problem about  2-3 days ago He describes as a palpable lump under his chin that is tender to touch There is no redness, discoloration, open wounds or sores. He denies fevers, sore throat, difficulty swallowing, oral swelling He does report recent sinus congestion and nasal drainage   Lab Results  Component Value Date   CHOL 131 02/11/2008   HDL 33 (L) 02/11/2008   LDLCALC 59 02/11/2008   TRIG 195 (H) 02/11/2008   CHOLHDL 4.0 Ratio 02/11/2008     Patient Active Problem List   Diagnosis Date Noted  . TOBACCO USER 03/12/2007  . MYOCARDIAL INFARCTION, ACUTE 03/12/2007  . CORONARY ARTERY DISEASE 03/12/2007  . DYSLIPIDEMIA 03/11/2007  . HYPERTENSION 03/11/2007  . BRONCHITIS, CHRONIC 03/11/2007  . GERD 03/11/2007    No past surgical history on file.  No family history on file.  Social History   Socioeconomic History  . Marital status: Married    Spouse name: Not on file  . Number of children: Not on file  . Years of education: Not on file  . Highest education level: Not on file  Occupational History  . Not on file  Social Needs  . Financial resource strain: Not on file  . Food insecurity:    Worry: Not on file    Inability: Not on file  . Transportation needs:    Medical: Not on file    Non-medical: Not on file  Tobacco Use  . Smoking status: Current Every Day Smoker    Packs/day: 0.33  . Smokeless tobacco: Never Used  Substance and Sexual Activity  . Alcohol use: Yes    Frequency: Never    Comment: socially  . Drug use: No  . Sexual activity: Not on file  Lifestyle  . Physical activity:    Days per week: Not on file    Minutes per session: Not on file  . Stress: Not on file  Relationships  . Social connections:    Talks on phone: Not on file    Gets together: Not on file    Attends religious service: Not on file    Active member of club or organization: Not on file    Attends meetings of clubs or organizations: Not on file    Relationship status: Not on file  .  Intimate partner violence:    Fear of current or ex partner: Not on file    Emotionally abused: Not on file    Physically abused: Not on file    Forced sexual activity: Not on file  Other Topics Concern  . Not on file  Social History Narrative  . Not on file     Current Outpatient Medications:  .  amLODipine (NORVASC) 10 MG tablet, Take 1 tablet (10 mg total) by mouth daily., Disp: 30 tablet, Rfl: 0 .  aspirin 325 MG EC tablet, Take 325 mg by mouth daily., Disp: , Rfl:  .  atorvastatin (LIPITOR) 10 MG tablet, Take 10 mg by mouth daily., Disp: , Rfl:  .  carvedilol (COREG) 25 MG tablet, Take 25 mg by mouth 2 (two) times daily with a meal., Disp: , Rfl:  .  furosemide (LASIX) 20 MG tablet, Take 1 tablet (20 mg total) by mouth daily., Disp: 30 tablet, Rfl: 1 .  losartan (COZAAR) 100 MG tablet, Take 1 tablet (100 mg total) by mouth daily., Disp: 30 tablet, Rfl: 1 .  metFORMIN (GLUCOPHAGE) 1000 MG tablet, Take 1,000 mg by mouth 2 (two) times daily with a meal., Disp: , Rfl:  .  Omega-3 Fatty Acids (FISH OIL PO), Take by mouth., Disp: , Rfl:  .  omeprazole (PRILOSEC) 20 MG capsule, Take 20 mg by mouth daily., Disp: , Rfl:  .  sitaGLIPtin (JANUVIA) 100 MG tablet, Take 1 tablet (100 mg total) by mouth daily., Disp: 30 tablet, Rfl: 0  No Known Allergies   Review of Systems  Constitutional: Negative for chills, diaphoresis and fever.  HENT: Positive for congestion.   Eyes: Negative for blurred vision and double vision.  Respiratory: Positive for shortness of breath. Negative for cough.   Cardiovascular: Negative for chest pain and leg swelling.  Gastrointestinal: Negative for heartburn, nausea and vomiting.  Genitourinary: Negative for frequency.  Musculoskeletal: Negative for myalgias.  Neurological: Negative for dizziness, tremors, weakness and headaches.    Objective  Vitals:   10/07/17 0906  BP: (!) 158/108  Pulse: 82  Resp: 16  Temp: 99.1 F (37.3 C)  TempSrc: Oral  SpO2:  98%  Weight: 237 lb (107.5 kg)  Height:  (1.626 m)    Body mass index is 40.68 kg/m.  Physical Exam Vital signs reviewed Constitutional: Patient appears well-developed and well-nourished. No distress.  HENT: Head: Normocephalic and atraumatic.  Nose: Nose normal. Mouth/Throat: Oropharynx is clear and moist. No oropharyngeal exudate.  Eyes: Conjunctivae and EOM are normal. Pupils are equal, round, and reactive to light. No scleral icterus.  Neck: Normal range of motion. Neck supple. No thyromegaly present. Palpable firm submental mass without erythema, drainage- mildly tender to palpation. Cardiovascular: Normal rate, regular  rhythm. No BLE edema. Distal pulses intact. Pulmonary/Chest: Effort normal and breath sounds normal. No respiratory distress. Abdominal: Soft. No distention. Musculoskeletal: Normal range of motion. No gross deformities Neurological: he is alert and oriented to person, place, and time. No cranial nerve deficit. Coordination, balance, strength, speech and gait are normal.  Skin: Skin is warm and dry. No rash noted. No erythema.  Psychiatric: Patient has a normal mood and affect. behavior is normal. Judgment and thought content normal.   Assessment & Plan RTC in 1 month for F/U of BP- adding clonidine  Neck mass Palpable submental mass on PE Imaging ordered today- will F/U with further recommendations pending test results - US Soft Tissue Head/Neck; Future - DG Chest 2 View; Future

## 2017-10-14 ENCOUNTER — Other Ambulatory Visit: Payer: Self-pay

## 2017-10-14 DIAGNOSIS — R221 Localized swelling, mass and lump, neck: Secondary | ICD-10-CM

## 2017-10-21 ENCOUNTER — Other Ambulatory Visit: Payer: Self-pay | Admitting: Nurse Practitioner

## 2017-10-22 ENCOUNTER — Telehealth: Payer: Self-pay | Admitting: Nurse Practitioner

## 2017-10-22 NOTE — Telephone Encounter (Signed)
Copied from CRM [id number]. Topic: Quick Communication - Rx Refill/Question >> Oct 22, 2017 11:13 AM Raquel SarnaHayes, Teresa G wrote: carvedilol (COREG) 25 MG tablet  Pt needing refill asap.  Pt is almost out.  [id number] [address]- [place], [place] - [phone] [facility] [address] [place] [address] [id number]

## 2017-10-24 ENCOUNTER — Other Ambulatory Visit: Payer: Self-pay | Admitting: Nurse Practitioner

## 2017-10-29 ENCOUNTER — Other Ambulatory Visit: Payer: Self-pay | Admitting: Nurse Practitioner

## 2017-10-29 DIAGNOSIS — I10 Essential (primary) hypertension: Secondary | ICD-10-CM

## 2017-11-06 ENCOUNTER — Other Ambulatory Visit: Payer: Medicare HMO

## 2017-11-14 ENCOUNTER — Emergency Department (HOSPITAL_COMMUNITY)
Admission: EM | Admit: 2017-11-14 | Discharge: 2017-11-14 | Disposition: A | Discharge status: Home / Self Care | Payer: Medicare HMO | Attending: Emergency Medicine | Admitting: Emergency Medicine

## 2017-11-14 ENCOUNTER — Other Ambulatory Visit: Payer: Self-pay

## 2017-11-14 ENCOUNTER — Encounter (HOSPITAL_COMMUNITY): Payer: Self-pay

## 2017-11-14 DIAGNOSIS — Z7984 Long term (current) use of oral hypoglycemic drugs: Secondary | ICD-10-CM | POA: Diagnosis not present

## 2017-11-14 DIAGNOSIS — K0889 Other specified disorders of teeth and supporting structures: Secondary | ICD-10-CM | POA: Insufficient documentation

## 2017-11-14 DIAGNOSIS — F17210 Nicotine dependence, cigarettes, uncomplicated: Secondary | ICD-10-CM | POA: Diagnosis not present

## 2017-11-14 DIAGNOSIS — I2510 Atherosclerotic heart disease of native coronary artery without angina pectoris: Secondary | ICD-10-CM | POA: Insufficient documentation

## 2017-11-14 DIAGNOSIS — K029 Dental caries, unspecified: Secondary | ICD-10-CM | POA: Insufficient documentation

## 2017-11-14 DIAGNOSIS — E119 Type 2 diabetes mellitus without complications: Secondary | ICD-10-CM | POA: Diagnosis not present

## 2017-11-14 DIAGNOSIS — I10 Essential (primary) hypertension: Secondary | ICD-10-CM

## 2017-11-14 MED ORDER — OXYCODONE-ACETAMINOPHEN 5-325 MG PO TABS
1.0000 | ORAL_TABLET | Freq: Once | ORAL | Status: AC
  Administered 2017-11-14: 1 via ORAL
  Filled 2017-11-14: qty 1

## 2017-11-14 MED ORDER — PENICILLIN V POTASSIUM 500 MG PO TABS: 500.0000 mg | ORAL_TABLET | Freq: Four times a day (QID) | ORAL | 0 refills | Status: AC

## 2017-11-14 MED ORDER — TRAMADOL HCL 50 MG PO TABS: 50.0000 mg | ORAL_TABLET | Freq: Four times a day (QID) | ORAL | 0 refills | Status: DC | PRN

## 2017-11-14 NOTE — ED Provider Notes (Signed)
[facility]-EMERGENCY DEPT Provider Note   CSN: [id number] Arrival date & time: 11/14/17  1741     History   Chief Complaint Chief Complaint  Patient presents with  . Dental Pain  . Hypertension    HPI Nathan Heffern. is a 57 y.o. male who presents to ED for evaluation of 1 day history of left upper dental pain.  States that symptoms began after he ate an apple.  He has not seen a dentist in 2 years.  Only reports mild improvement with ibuprofen.  Denies any trouble breathing or trouble swallowing, neck pain, rashes, fever, drainage or bleeding from site, other trauma to the area.  Patient reports compliance with his home blood pressure medication.  Denies any chest pain, headache, vision changes, changes in urination.  HPI  Past Medical History:  Diagnosis Date  . Coronary artery disease   . Diabetes mellitus without complication (HCC)   . Hypertension     Patient Active Problem List   Diagnosis Date Noted  . Type 2 diabetes mellitus with complication, without long-term current use of insulin (HCC) 10/07/2017  . TOBACCO USER 03/12/2007  . MYOCARDIAL INFARCTION, ACUTE 03/12/2007  . CORONARY ARTERY DISEASE 03/12/2007  . Hyperlipidemia 03/11/2007  . Essential hypertension 03/11/2007  . BRONCHITIS, CHRONIC 03/11/2007  . GERD 03/11/2007    History reviewed. No pertinent surgical history.      Home Medications    Prior to Admission medications   Medication Sig Start Date End Date Taking? Authorizing Provider  amLODipine (NORVASC) 10 MG tablet Take 1 tablet (10 mg total) by mouth daily. 08/27/17   Rise Mu, PA-C  aspirin 325 MG EC tablet Take 325 mg by mouth daily.    [provider]  atorvastatin (LIPITOR) 20 MG tablet Take 0.5 tablets (10 mg total) by mouth daily. 10/07/17   Evaristo Bury, NP  carvedilol (COREG) 25 MG tablet TAKE 1 TABLET BY MOUTH TWICE A DAY 10/25/17   Olive Bass, FNP  cloNIDine (CATAPRES)  0.1 MG tablet TAKE 1 TABLET (0.1 MG TOTAL) BY MOUTH 2 (TWO) TIMES DAILY. 10/29/17   Evaristo Bury, NP  furosemide (LASIX) 20 MG tablet Take 1 tablet (20 mg total) by mouth daily. 10/07/17   Evaristo Bury, NP  losartan (COZAAR) 100 MG tablet Take 1 tablet (100 mg total) by mouth daily. 09/23/17   Mardella Layman, MD  metFORMIN (GLUCOPHAGE) 1000 MG tablet Take 1 tablet (1,000 mg total) by mouth 2 (two) times daily with a meal. 10/07/17   Evaristo Bury, NP  Omega-3 Fatty Acids (FISH OIL PO) Take by mouth.    [provider]  omeprazole (PRILOSEC) 20 MG capsule Take 20 mg by mouth daily.    [provider]  penicillin v potassium (VEETID) 500 MG tablet Take 1 tablet (500 mg total) by mouth 4 (four) times daily for 7 days. 11/14/17 11/21/17  Sofya Moustafa, PA-C  sitaGLIPtin (JANUVIA) 100 MG tablet Take 1 tablet (100 mg total) by mouth daily. 10/07/17   Evaristo Bury, NP  traMADol (ULTRAM) 50 MG tablet Take 1 tablet (50 mg total) by mouth every 6 (six) hours as needed. 11/14/17   Dietrich Pates, PA-C    Family History History reviewed. No pertinent family history.  Social History Social History   Tobacco Use  . Smoking status: Current Every Day Smoker    Packs/day: 0.33  . Smokeless tobacco: Never Used  Substance Use Topics  . Alcohol use: Yes  Frequency: Never    Comment: socially  . Drug use: No     Allergies   Patient has no known allergies.   Review of Systems Review of Systems  Constitutional: Negative for appetite change, chills and fever.  HENT: Positive for dental problem. Negative for ear pain, rhinorrhea, sneezing and sore throat.   Eyes: Negative for photophobia and visual disturbance.  Respiratory: Negative for cough, chest tightness, shortness of breath and wheezing.   Cardiovascular: Negative for chest pain and palpitations.  Gastrointestinal: Negative for abdominal pain, blood in stool, constipation, diarrhea, nausea and vomiting.    Genitourinary: Negative for dysuria, hematuria and urgency.  Musculoskeletal: Negative for myalgias.  Skin: Negative for rash.  Neurological: Negative for dizziness, weakness, light-headedness and headaches.     Physical Exam Updated Vital Signs BP (!) 184/122   Pulse 79   Temp 97.7 F (36.5 C) (Oral)   SpO2 98%   Physical Exam  Constitutional: He appears well-developed and well-nourished. No distress.  HENT:  Head: Normocephalic and atraumatic.  Nose: Nose normal.  Mouth/Throat: Uvula is midline, oropharynx is clear and moist and mucous membranes are normal. He does not have dentures. No oral lesions. No trismus in the jaw. Abnormal dentition. Dental caries present. No dental abscesses, uvula swelling or lacerations. No tonsillar exudate.    Overall poor dentition and several dental caries noted.  There are several chipped and missing teeth present.  Tenderness to palpation of the gumline with mild inflammation in the indicated teeth.  No gross dental abscess or site of drainage at this time. No facial, neck or cheek swelling noted. No pooling of secretions or trismus.  Normal voice noted with no difficulty swallowing or breathing.  No submandibular crepitus, erythema or edema noted.  Eyes: Conjunctivae and EOM are normal. Left eye exhibits no discharge. No scleral icterus.  Neck: Normal range of motion. Neck supple.  Cardiovascular: Normal rate, regular rhythm, normal heart sounds and intact distal pulses. Exam reveals no gallop and no friction rub.  No murmur heard. Pulmonary/Chest: Effort normal and breath sounds normal. No respiratory distress.  Abdominal: Soft. Bowel sounds are normal. He exhibits no distension. There is no tenderness. There is no guarding.  Musculoskeletal: Normal range of motion. He exhibits no edema.  Neurological: He is alert. He exhibits normal muscle tone. Coordination normal.  Skin: Skin is warm and dry. No rash noted.  Psychiatric: He has a normal  mood and affect.  Nursing note and vitals reviewed.    ED Treatments / Results  Labs (all labs ordered are listed, but only abnormal results are displayed) Labs Reviewed - No data to display  EKG None  Radiology No results found.  Procedures Procedures (including critical care time)  Medications Ordered in ED Medications  oxyCODONE-acetaminophen (PERCOCET/ROXICET) 5-325 MG per tablet 1 tablet (1 tablet Oral Given 11/14/17 1938)     Initial Impression / Assessment and Plan / ED Course  I have reviewed the triage vital signs and the nursing notes.  Pertinent labs & imaging results that were available during my care of the patient were reviewed by me and considered in my medical decision making (see chart for details).     Patient with dentalgia. On exam, there is no evidence of a drainable abscess. No trismus, glossal elevation, unilateral tonsillar swelling. No evidence of retropharyngeal or peritonsillar abscess or Ludwig angina. Will treat with Ultram, PCN. Pt instructed to follow-up with dentist as soon as possible. Resource guide provided with AVS. Patient's blood  pressure initially elevated in the emergency department today. Patient denies headache, change in vision, numbness, weakness, chest pain, dyspnea, dizziness, or lightheadedness therefore doubt hypertensive emergency. Discussed elevated blood pressure with the patient and the need for primary care follow up with potential need to initiate or change antihypertensive medications and or for further evaluation. Discussed return precaution signs/symptoms for hypertensive emergency as listed above with the patient.   He does not feel that he needs to be admitted for his high blood pressure.  He does not need any refills for his blood pressure medications.  Told patient to take his home dose of hypertension medication prior to discharge.  Portions of this note were generated with Scientist, clinical (histocompatibility and immunogenetics). Dictation errors may  occur despite best attempts at proofreading.    Final Clinical Impressions(s) / ED Diagnoses   Final diagnoses:  Hypertension, unspecified type  Pain, dental    ED Discharge Orders        Ordered    traMADol (ULTRAM) 50 MG tablet  Every 6 hours PRN     11/14/17 2035    penicillin v potassium (VEETID) 500 MG tablet  4 times daily     11/14/17 2035       Dietrich Pates, PA-C 11/14/17 2042    Alvira Monday, MD 11/16/17 1232

## 2017-11-14 NOTE — ED Triage Notes (Signed)
Pt reports R upper dental pain. He also states that his BP is always high and he takes multiple meds for it. He states that it is high now d/t his pain. A&Ox4. Ambulatory.

## 2017-11-21 ENCOUNTER — Ambulatory Visit (INDEPENDENT_AMBULATORY_CARE_PROVIDER_SITE_OTHER): Payer: Medicare HMO | Admitting: Nurse Practitioner

## 2017-11-21 ENCOUNTER — Encounter: Payer: Self-pay | Admitting: Nurse Practitioner

## 2017-11-21 VITALS — BP 142/84 | HR 56 | Temp 98.4°F | Resp 18 | Ht 64.0 in | Wt 238.0 lb

## 2017-11-21 DIAGNOSIS — R221 Localized swelling, mass and lump, neck: Secondary | ICD-10-CM | POA: Diagnosis not present

## 2017-11-21 DIAGNOSIS — K0889 Other specified disorders of teeth and supporting structures: Secondary | ICD-10-CM | POA: Diagnosis not present

## 2017-11-21 DIAGNOSIS — I10 Essential (primary) hypertension: Secondary | ICD-10-CM | POA: Diagnosis not present

## 2017-11-21 DIAGNOSIS — E785 Hyperlipidemia, unspecified: Secondary | ICD-10-CM | POA: Diagnosis not present

## 2017-11-21 MED ORDER — ATORVASTATIN CALCIUM 20 MG PO TABS: 20.0000 mg | ORAL_TABLET | Freq: Every day | ORAL | 1 refills | Status: DC

## 2017-11-21 MED ORDER — AMOXICILLIN-POT CLAVULANATE 875-125 MG PO TABS: 1.0000 | ORAL_TABLET | Freq: Two times a day (BID) | ORAL | 0 refills | Status: DC

## 2017-11-21 MED ORDER — LOSARTAN POTASSIUM 100 MG PO TABS: 100.0000 mg | ORAL_TABLET | Freq: Every day | ORAL | 1 refills | Status: DC

## 2017-11-21 MED ORDER — AMLODIPINE BESYLATE 10 MG PO TABS: 10.0000 mg | ORAL_TABLET | Freq: Every day | ORAL | 1 refills | Status: DC

## 2017-11-21 MED ORDER — TRAMADOL HCL 50 MG PO TABS: 50.0000 mg | ORAL_TABLET | Freq: Four times a day (QID) | ORAL | 0 refills | Status: DC | PRN

## 2017-11-21 NOTE — Patient Instructions (Addendum)
I have sent a new prescription for augmentin twice daily and tramadol 1 pill every 6 hours as needed for your tooth pain Please try to avoid large doses of NSAIDS such as ibuprofen, aleve, naproxen- these can cause your blood pressure to go up. You may try tylenol for your pain as well, no more than 3000 mg or 3 grams in a 24 hour period  Please return in about 1 month so I can see how you are doing and recheck your blood pressure. Our goal is to get your blood pressure under 140/90 all the time.

## 2017-11-21 NOTE — Assessment & Plan Note (Signed)
Stable, continue statin Requesting refill today - atorvastatin (LIPITOR) 20 MG tablet; Take 1 tablet (20 mg total) by mouth daily.  Dispense: 90 tablet; Refill: 1

## 2017-11-21 NOTE — Assessment & Plan Note (Signed)
Frequent use of NSAIDS could be contributing to elevated BP readings His blood pressure has greatly improved with use of clonidine, we will continue current medications and have him follow up in 1 month for a BP recheck - losartan (COZAAR) 100 MG tablet; Take 1 tablet (100 mg total) by mouth daily.  Dispense: 90 tablet; Refill: 1 - amLODipine (NORVASC) 10 MG tablet; Take 1 tablet (10 mg total) by mouth daily.  Dispense: 90 tablet; Refill: 1

## 2017-11-21 NOTE — Progress Notes (Signed)
Name: Nathan Jenkins.   MRN: [id number]    DOB: 22-Apr-1961   Date:11/21/2017       Progress Note  Subjective  Chief Complaint  Chief Complaint  Patient presents with  . Follow-up    HPI Mr Going is coming in today to follow up for HTN and a neck mass. He is mostly concerned with current dental pain and infection, so we will address this acute issue today as well  Dental pain- This is a new problem He c/o constant aching pain to left upper teeth He was seen in the ED last week and given penicillin and tramadol for acute pain and dental infection. He has completed both medications with persistent pain and Is having trouble scheduling with a dentist due to cost He has also been taking ibuprofen 800 several times a day with minimal relief of the pain He is asking for another course of antibiotics and pain medicine today He denies fevers, difficulty swallowing, gum bleeding, neck pain, rash.  Hypertension -maintained on amlodipine 10, carvedilol  BID, losartan 100 mg, lasix 20,  and clonidine 0.1mg  BID was added at last office visit for continued elevated readings. Reports daily medication compliance without adverse medication effects. Does not report home BP readings. Denies headaches, vision changes, chest pain, edema.  BP Readings from Last 3 Encounters:  11/21/17 (!) 142/84  11/14/17 (!) 184/122  10/07/17 (!) 158/108   Neck mass - a submental mass was palpated on last OV and follow up CXR, neck US were ordered. His cXR did not show any acute abnormalities, he did not go for his ultrasound He says the mass resolved and he is no longer concerned, does not feel he needs the ultrasound   Patient Active Problem List   Diagnosis Date Noted  . Type 2 diabetes mellitus with complication, without long-term current use of insulin (HCC) 10/07/2017  . TOBACCO USER 03/12/2007  . MYOCARDIAL INFARCTION, ACUTE 03/12/2007  . CORONARY ARTERY DISEASE 03/12/2007  . Hyperlipidemia  03/11/2007  . Essential hypertension 03/11/2007  . BRONCHITIS, CHRONIC 03/11/2007  . GERD 03/11/2007    No past surgical history on file.  No family history on file.  Social History   Socioeconomic History  . Marital status: Married    Spouse name: Not on file  . Number of children: Not on file  . Years of education: Not on file  . Highest education level: Not on file  Occupational History  . Not on file  Social Needs  . Financial resource strain: Not on file  . Food insecurity:    Worry: Not on file    Inability: Not on file  . Transportation needs:    Medical: Not on file    Non-medical: Not on file  Tobacco Use  . Smoking status: Current Every Day Smoker    Packs/day: 0.33  . Smokeless tobacco: Never Used  Substance and Sexual Activity  . Alcohol use: Yes    Frequency: Never    Comment: socially  . Drug use: No  . Sexual activity: Not on file  Lifestyle  . Physical activity:    Days per week: Not on file    Minutes per session: Not on file  . Stress: Not on file  Relationships  . Social connections:    Talks on phone: Not on file    Gets together: Not on file    Attends religious service: Not on file    Active member of club or organization: Not on file  Attends meetings of clubs or organizations: Not on file    Relationship status: Not on file  . Intimate partner violence:    Fear of current or ex partner: Not on file    Emotionally abused: Not on file    Physically abused: Not on file    Forced sexual activity: Not on file  Other Topics Concern  . Not on file  Social History Narrative  . Not on file     Current Outpatient Medications:  .  amLODipine (NORVASC) 10 MG tablet, Take 1 tablet (10 mg total) by mouth daily., Disp: 30 tablet, Rfl: 0 .  amoxicillin-clavulanate (AUGMENTIN) 875-125 MG tablet, Take 1 tablet by mouth 2 (two) times daily., Disp: , Rfl:  .  aspirin 325 MG EC tablet, Take 325 mg by mouth daily., Disp: , Rfl:  .  atorvastatin  (LIPITOR) 20 MG tablet, Take 0.5 tablets (10 mg total) by mouth daily., Disp: 30 tablet, Rfl: 1 .  carvedilol (COREG) 25 MG tablet, TAKE 1 TABLET BY MOUTH TWICE A DAY, Disp: 180 tablet, Rfl: 0 .  cloNIDine (CATAPRES) 0.1 MG tablet, TAKE 1 TABLET (0.1 MG TOTAL) BY MOUTH 2 (TWO) TIMES DAILY., Disp: 60 tablet, Rfl: 0 .  furosemide (LASIX) 20 MG tablet, Take 1 tablet (20 mg total) by mouth daily., Disp: 90 tablet, Rfl: 1 .  losartan (COZAAR) 100 MG tablet, Take 1 tablet (100 mg total) by mouth daily., Disp: 30 tablet, Rfl: 1 .  metFORMIN (GLUCOPHAGE) 1000 MG tablet, Take 1 tablet (1,000 mg total) by mouth 2 (two) times daily with a meal., Disp: 90 tablet, Rfl: 1 .  Omega-3 Fatty Acids (FISH OIL PO), Take by mouth., Disp: , Rfl:  .  omeprazole (PRILOSEC) 20 MG capsule, Take 20 mg by mouth daily., Disp: , Rfl:  .  penicillin v potassium (VEETID) 500 MG tablet, Take 1 tablet (500 mg total) by mouth 4 (four) times daily for 7 days., Disp: 28 tablet, Rfl: 0 .  sitaGLIPtin (JANUVIA) 100 MG tablet, Take 1 tablet (100 mg total) by mouth daily., Disp: 90 tablet, Rfl: 1 .  traMADol (ULTRAM) 50 MG tablet, Take 1 tablet (50 mg total) by mouth every 6 (six) hours as needed., Disp: 6 tablet, Rfl: 0  No Known Allergies   ROS See HPI  Objective  Vitals:   11/21/17 0929  BP: (!) 142/84  Pulse: (!) 56  Resp: 18  Temp: 98.4 F (36.9 C)  TempSrc: Oral  SpO2: 96%  Weight: 238 lb (108 kg)  Height:  (1.626 m)    Body mass index is 40.85 kg/m.  Physical Exam Vital signs reviewed Constitutional: Patient appears well-developed and well-nourished. No distress.  HENT: Head: Normocephalic and atraumatic.  Nose: Nose normal. Mouth/Throat: Oropharynx is clear and moist. No oropharyngeal exudate. Dental caries and poor dentition. Eyes: Conjunctivae and EOM are normal. Pupils are equal, round, and reactive to light. No scleral icterus.  Neck: Normal range of motion. Neck supple. No cervical  adenopathy. Cardiovascular: Normal rate, regular rhythm. No BLE edema. Distal pulses intact. Pulmonary/Chest: Effort normal and breath sounds normal. No respiratory distress. Neurological: he is alert and oriented to person, place, and time. No cranial nerve deficit. Coordination, balance, strength, speech and gait are normal.  Skin: Skin is warm and dry. No rash noted. No erythema.  Psychiatric: Patient has a normal mood and affect. behavior is normal. Judgment and thought content normal.   Assessment & Plan RTC in 1 month for F/U: HTN  Neck mass Resolved Could have been related to allergies or dental problems Will F/U for new, worsening symptoms or if neck mass returns  Pain, dental Dental caries, severe pain, concern for persistent dental infection Will rx with augmentin and short Rx for PRN tramadol-dosing and side effects discussed Advised follow up with dentist as soon as possible and we discussed limiting use of antibiotics to acute infection, pain medication to severe pain due to possible adverse effects of taking medications long term and he verablized understanding. Dental resources handout provided We also discussed limiting use of NSAIDS due to HTN F/U for new, worsening symptoms - amoxicillin-clavulanate (AUGMENTIN) 875-125 MG tablet; Take 1 tablet by mouth 2 (two) times daily.  Dispense: 20 tablet; Refill: 0 - traMADol (ULTRAM) 50 MG tablet; Take 1 tablet (50 mg total) by mouth every 6 (six) hours as needed.  Dispense: 20 tablet; Refill: 0

## 2017-11-26 ENCOUNTER — Other Ambulatory Visit: Payer: Self-pay | Admitting: Nurse Practitioner

## 2017-11-26 DIAGNOSIS — I10 Essential (primary) hypertension: Secondary | ICD-10-CM

## 2017-12-21 ENCOUNTER — Other Ambulatory Visit: Payer: Self-pay | Admitting: Nurse Practitioner

## 2017-12-21 DIAGNOSIS — I10 Essential (primary) hypertension: Secondary | ICD-10-CM

## 2017-12-23 ENCOUNTER — Encounter: Payer: Self-pay | Admitting: Nurse Practitioner

## 2017-12-23 ENCOUNTER — Ambulatory Visit (INDEPENDENT_AMBULATORY_CARE_PROVIDER_SITE_OTHER): Payer: Medicare HMO | Admitting: Nurse Practitioner

## 2017-12-23 DIAGNOSIS — E118 Type 2 diabetes mellitus with unspecified complications: Secondary | ICD-10-CM

## 2017-12-23 DIAGNOSIS — I10 Essential (primary) hypertension: Secondary | ICD-10-CM | POA: Diagnosis not present

## 2017-12-23 MED ORDER — SITAGLIPTIN PHOSPHATE 100 MG PO TABS: 100.0000 mg | ORAL_TABLET | Freq: Every day | ORAL | 1 refills | Status: AC

## 2017-12-23 MED ORDER — CARVEDILOL 25 MG PO TABS: 25.0000 mg | ORAL_TABLET | Freq: Two times a day (BID) | ORAL | 0 refills | Status: DC

## 2017-12-23 MED ORDER — FUROSEMIDE 20 MG PO TABS: 20.0000 mg | ORAL_TABLET | Freq: Every day | ORAL | 1 refills | Status: AC

## 2017-12-23 NOTE — Patient Instructions (Signed)
I would like to see you back in about 3 months, so we can see how your blood pressure is doing and recheck your A1c, if you do not transfer your care to [place].  Also, we really need to get your blood pressure consistently below 140/90, so let me know  if you keep getting readings above this at home.  It was good to see you. Thanks for letting me take care of you today.

## 2017-12-23 NOTE — Assessment & Plan Note (Signed)
We should try to get his BP under 140/90 but he declines medication changes today He is hopeful that his BP will improve with diet and exercise and quitting smoking, which he is going to try to work on over the next few months once he moves back to [place] Encouraged to monitor BP at home and F/U for BP readings over 140/90 - furosemide (LASIX) 20 MG tablet; Take 1 tablet (20 mg total) by mouth daily.  Dispense: 90 tablet; Refill: 1

## 2017-12-23 NOTE — Progress Notes (Signed)
Name: Kassie MendsZannie Citron Jr.   MRN: [id number]    DOB: 04/29/1961   Date:12/23/2017       Progress Note  Subjective  Chief Complaint  Chief Complaint  Patient presents with  . Follow-up    HPI  Hypertension -maintained on amlodipine 10, carvedilol 25mg  BID, losartan 100 mg, lasix 20 ,  and clonidine 0.1mg  BID. At his last OV his blood pressure had improved but was still slightly elevated and He told me that he had been taking NSAIDs frequently for dental pain, so we discussed logging BP for 1 month and returning for a follow up visit- no blood pressure medication changes were made. Reports he takes his medications daily as prescribed  without noted adverse medication effects. He has a blood pressure monitor at home but does not routinely check his blood pressure. He does report mild sob which is chronic for him- he relates this to being a smoker and says today he is starting to think about quitting. He tells me today that he plans to move back to [place] in the next few months and plans to start riding his bike again daily and watching his diet to try to lose some weight and improve his BP. Denies headaches, vision changes, chest pain, shortness of breath, edema. Overall feels well today.  BP Readings from Last 3 Encounters:  12/23/17 (!) 144/92  11/21/17 (!) 142/84  11/14/17 (!) 184/122     Patient Active Problem List   Diagnosis Date Noted  . Type 2 diabetes mellitus with complication, without long-term current use of insulin (HCC) 10/07/2017  . TOBACCO USER 03/12/2007  . MYOCARDIAL INFARCTION, ACUTE 03/12/2007  . CORONARY ARTERY DISEASE 03/12/2007  . Hyperlipidemia 03/11/2007  . Essential hypertension 03/11/2007  . BRONCHITIS, CHRONIC 03/11/2007  . GERD 03/11/2007    No past surgical history on file.  No family history on file.  Social History   Socioeconomic History  . Marital status: Married    Spouse name: Not on file  . Number of children: Not on file  . Years of  education: Not on file  . Highest education level: Not on file  Occupational History  . Not on file  Social Needs  . Financial resource strain: Not on file  . Food insecurity:    Worry: Not on file    Inability: Not on file  . Transportation needs:    Medical: Not on file    Non-medical: Not on file  Tobacco Use  . Smoking status: Current Every Day Smoker    Packs/day: 0.33  . Smokeless tobacco: Never Used  Substance and Sexual Activity  . Alcohol use: Yes    Frequency: Never    Comment: socially  . Drug use: No  . Sexual activity: Not on file  Lifestyle  . Physical activity:    Days per week: Not on file    Minutes per session: Not on file  . Stress: Not on file  Relationships  . Social connections:    Talks on phone: Not on file    Gets together: Not on file    Attends religious service: Not on file    Active member of club or organization: Not on file    Attends meetings of clubs or organizations: Not on file    Relationship status: Not on file  . Intimate partner violence:    Fear of current or ex partner: Not on file    Emotionally abused: Not on file    Physically abused:  Not on file    Forced sexual activity: Not on file  Other Topics Concern  . Not on file  Social History Narrative  . Not on file     Current Outpatient Medications:  .  acetaminophen (TYLENOL) 500 MG tablet, Take 500 mg by mouth every 6 (six) hours as needed., Disp: , Rfl:  .  amLODipine (NORVASC) 10 MG tablet, Take 1 tablet (10 mg total) by mouth daily., Disp: 90 tablet, Rfl: 1 .  amoxicillin-clavulanate (AUGMENTIN) 875-125 MG tablet, Take 1 tablet by mouth 2 (two) times daily., Disp: 20 tablet, Rfl: 0 .  aspirin 325 MG EC tablet, Take 325 mg by mouth daily., Disp: , Rfl:  .  atorvastatin (LIPITOR) 20 MG tablet, Take 1 tablet (20 mg total) by mouth daily., Disp: 90 tablet, Rfl: 1 .  carvedilol (COREG) 25 MG tablet, Take 1 tablet (25 mg total) by mouth 2 (two) times daily., Disp: 180  tablet, Rfl: 0 .  cloNIDine (CATAPRES) 0.1 MG tablet, TAKE 1 TABLET (0.1 MG TOTAL) BY MOUTH 2 (TWO) TIMES DAILY., Disp: 60 tablet, Rfl: 0 .  furosemide (LASIX) 20 MG tablet, Take 1 tablet (20 mg total) by mouth daily., Disp: 90 tablet, Rfl: 1 .  losartan (COZAAR) 100 MG tablet, Take 1 tablet (100 mg total) by mouth daily., Disp: 90 tablet, Rfl: 1 .  metFORMIN (GLUCOPHAGE) 1000 MG tablet, Take 1 tablet (1,000 mg total) by mouth 2 (two) times daily with a meal., Disp: 90 tablet, Rfl: 1 .  Omega-3 Fatty Acids (FISH OIL PO), Take by mouth., Disp: , Rfl:  .  omeprazole (PRILOSEC) 20 MG capsule, Take 20 mg by mouth daily., Disp: , Rfl:  .  sitaGLIPtin (JANUVIA) 100 MG tablet, Take 1 tablet (100 mg total) by mouth daily., Disp: 90 tablet, Rfl: 1 .  traMADol (ULTRAM) 50 MG tablet, Take 1 tablet (50 mg total) by mouth every 6 (six) hours as needed., Disp: 20 tablet, Rfl: 0  No Known Allergies   ROS See HPI  Objective  Vitals:   12/23/17 0934  BP: (!) 144/92  Pulse: 92  Resp: 16  Temp: 98.2 F (36.8 C)  TempSrc: Oral  SpO2: 98%  Weight: 239 lb (108.4 kg)  Height: 5\' 4"  (1.626 m)    Body mass index is 41.02 kg/m.  Physical Exam Vital signs reviewed Constitutional: Patient appears well-developed and well-nourished. No distress.  HENT: Head: Normocephalic and atraumatic. Nose: Nose normal. Mouth/Throat: Oropharynx is clear and moist. No oropharyngeal exudate. Eyes: Conjunctivae and EOM are normal. Pupils are equal, round, and reactive to light. No scleral icterus.  Neck: Normal range of motion. Neck supple. Cardiovascular: Normal rate, regular rhythm.No BLE edema. Distal pulses intact. Pulmonary/Chest: Effort normal and breath sounds normal. No respiratory distress. Neurological: he is alert and oriented to person, place, and time. Coordination, balance, strength, speech and gait are normal.  Skin: Skin is warm and dry. No rash noted. No erythema.  Psychiatric: Patient has a normal  mood and affect. behavior is normal. Judgment and thought content normal.   Assessment & Plan He tells me today that he is planning to move back to [place] and will likely not return to me for care Recommend he follow back up with me in 3 months for DM health maintenance, A1c and to recheck his BP if he does not move to [place] We also discussed the role of healthy diet and exercise in the management of chronic conditions and overall improvement in health

## 2017-12-23 NOTE — Assessment & Plan Note (Signed)
Stable, continue current meds - sitaGLIPtin (JANUVIA) 100 MG tablet; Take 1 tablet (100 mg total) by mouth daily.  Dispense: 90 tablet; Refill: 1

## 2017-12-30 ENCOUNTER — Other Ambulatory Visit: Payer: Self-pay

## 2017-12-30 DIAGNOSIS — I10 Essential (primary) hypertension: Secondary | ICD-10-CM

## 2017-12-30 MED ORDER — CLONIDINE HCL 0.1 MG PO TABS
0.1000 mg | ORAL_TABLET | Freq: Two times a day (BID) | ORAL | 2 refills | Status: DC
Start: 2017-12-30 — End: 2018-03-29

## 2018-01-25 ENCOUNTER — Other Ambulatory Visit: Payer: Self-pay | Admitting: Nurse Practitioner

## 2018-01-25 DIAGNOSIS — E118 Type 2 diabetes mellitus with unspecified complications: Secondary | ICD-10-CM

## 2018-02-18 ENCOUNTER — Other Ambulatory Visit: Payer: Self-pay | Admitting: Nurse Practitioner

## 2018-02-18 DIAGNOSIS — E118 Type 2 diabetes mellitus with unspecified complications: Secondary | ICD-10-CM

## 2018-03-27 ENCOUNTER — Ambulatory Visit: Payer: Medicare HMO | Admitting: Nurse Practitioner

## 2018-03-27 DIAGNOSIS — Z0289 Encounter for other administrative examinations: Secondary | ICD-10-CM

## 2018-03-28 ENCOUNTER — Other Ambulatory Visit: Payer: Self-pay | Admitting: Nurse Practitioner

## 2018-03-28 DIAGNOSIS — E118 Type 2 diabetes mellitus with unspecified complications: Secondary | ICD-10-CM

## 2018-03-29 ENCOUNTER — Other Ambulatory Visit: Payer: Self-pay | Admitting: Nurse Practitioner

## 2018-03-29 DIAGNOSIS — I10 Essential (primary) hypertension: Secondary | ICD-10-CM

## 2018-04-12 ENCOUNTER — Other Ambulatory Visit: Payer: Self-pay | Admitting: Nurse Practitioner

## 2018-04-12 DIAGNOSIS — E785 Hyperlipidemia, unspecified: Secondary | ICD-10-CM

## 2018-04-19 ENCOUNTER — Other Ambulatory Visit: Payer: Self-pay | Admitting: Nurse Practitioner

## 2018-05-12 ENCOUNTER — Other Ambulatory Visit: Payer: Self-pay | Admitting: Nurse Practitioner

## 2018-05-12 DIAGNOSIS — I10 Essential (primary) hypertension: Secondary | ICD-10-CM

## 2018-06-27 ENCOUNTER — Other Ambulatory Visit: Payer: Self-pay | Admitting: Nurse Practitioner

## 2018-06-27 DIAGNOSIS — I10 Essential (primary) hypertension: Secondary | ICD-10-CM

## 2019-04-21 IMAGING — DX DG CHEST 2V
2 series · 2 of 2 positions shown · non-contrast
Comparison: Radiographs July 25, 2008.

CLINICAL DATA: Chest pain, shortness of breath.

EXAM:
CHEST  2 VIEW

[chest pa]
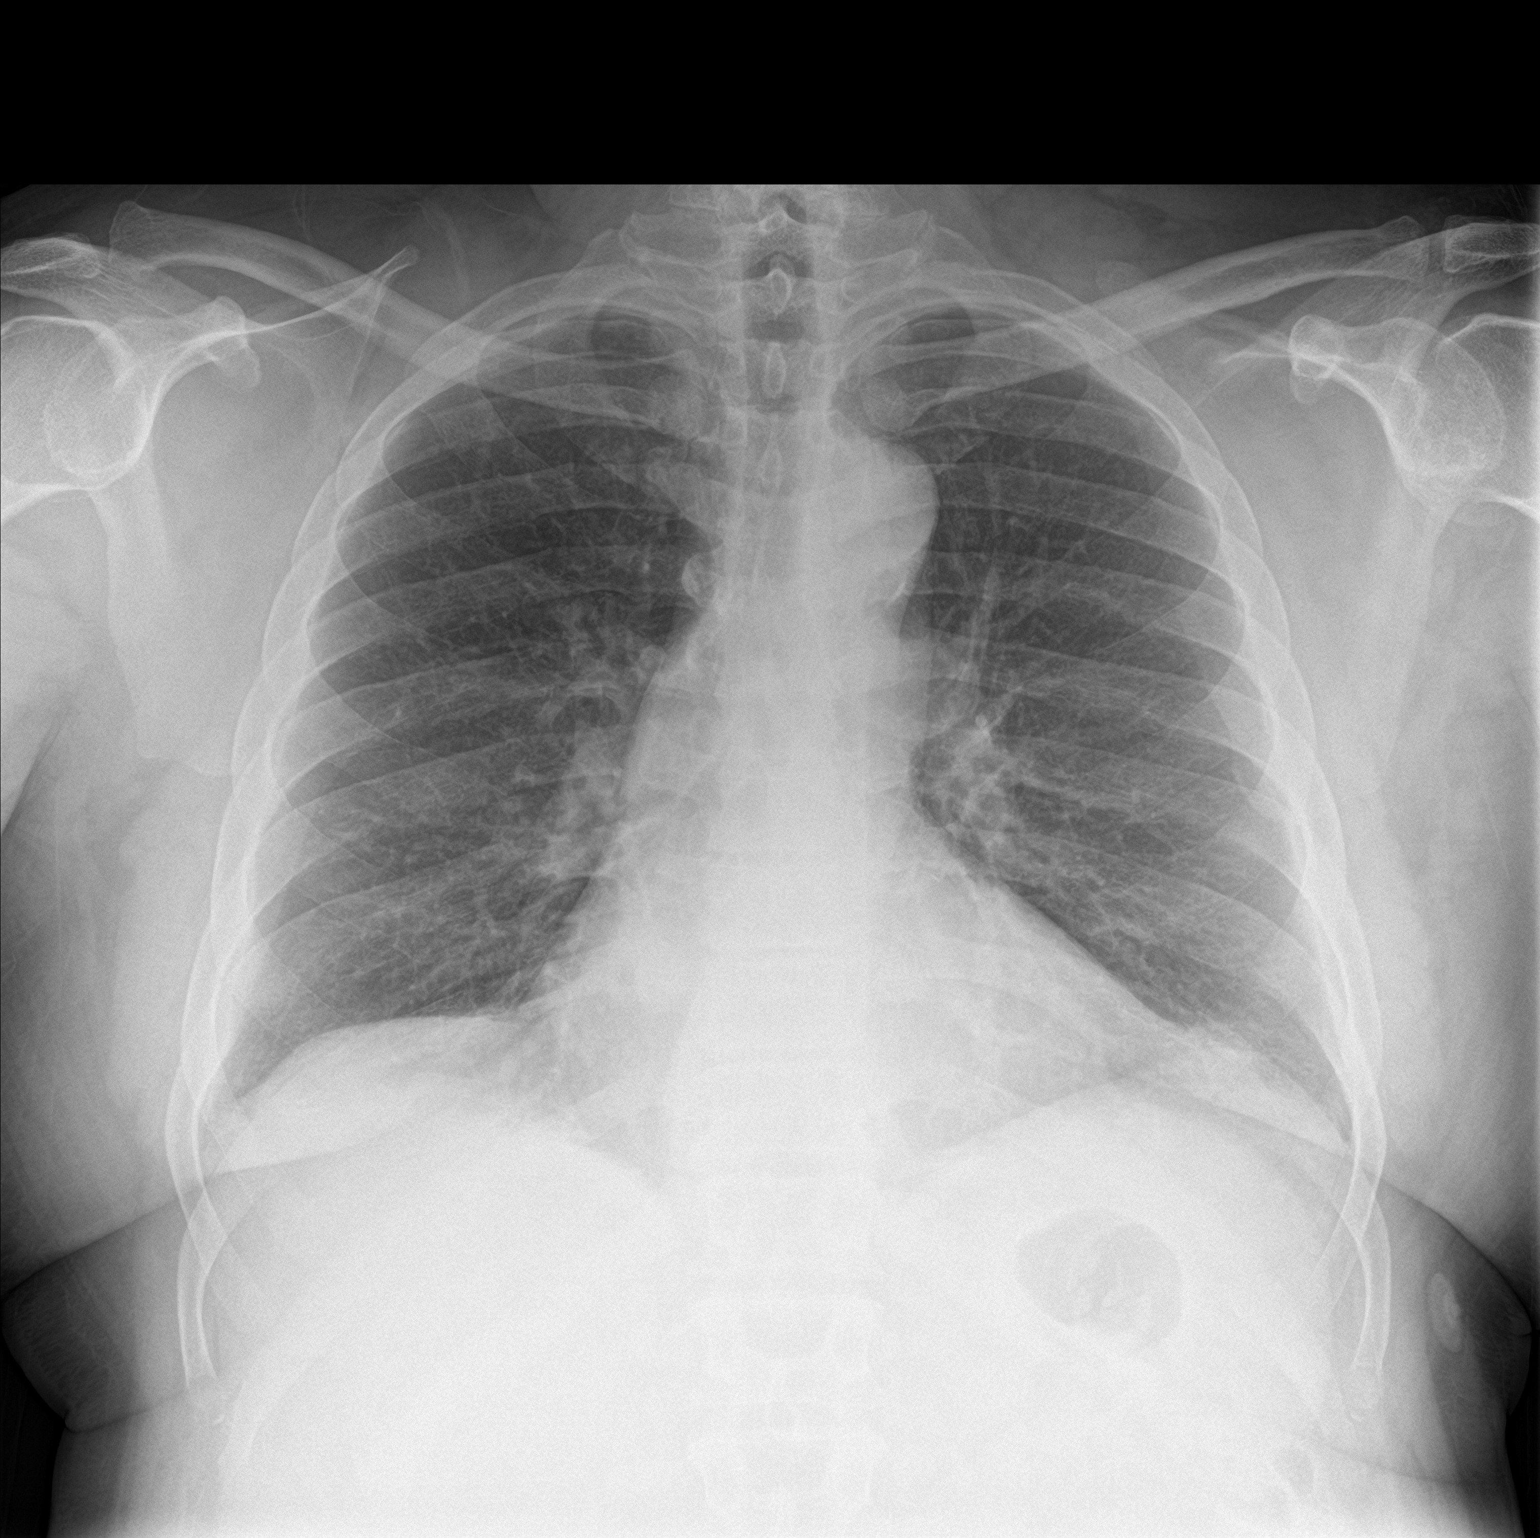

[chest lat]
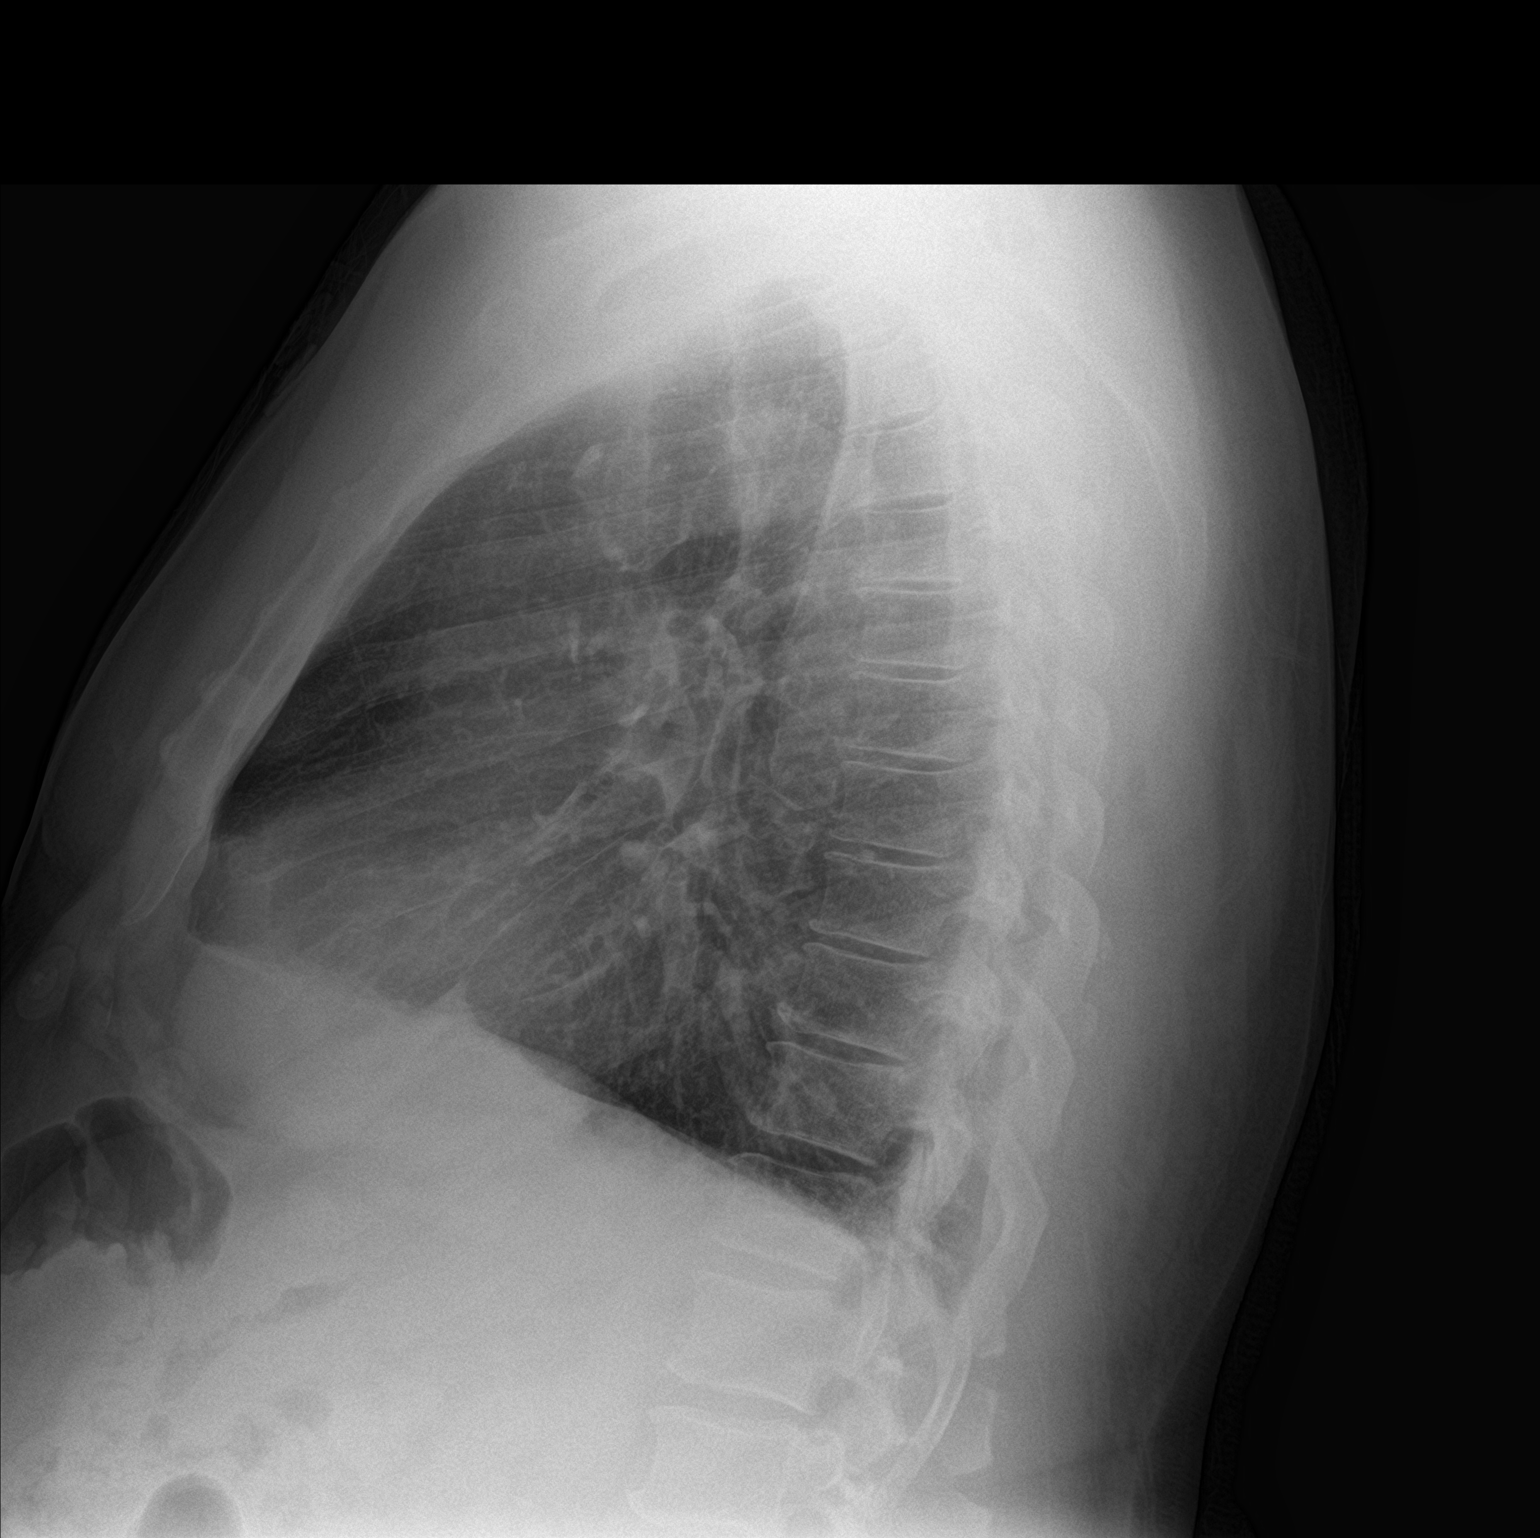

[2 of 2 positions shown; findings below may reference images not displayed]

FINDINGS: The heart size and mediastinal contours are within normal limits.
Both lungs are clear. No pneumothorax or pleural effusion is noted.
Atherosclerosis of thoracic aorta is noted. The visualized skeletal
structures are unremarkable.
IMPRESSION: No active cardiopulmonary disease.  Aortic atherosclerosis.
